# Patient Record
Sex: Male | Born: 1975 | Race: White | Hispanic: No | Marital: Married | Smoking: Current every day smoker
Health system: Southern US, Community
[De-identification: ages and names within clinical notes are randomized; demographics above are authoritative.]

## PROBLEM LIST (undated history)

## (undated) DIAGNOSIS — M1990 Unspecified osteoarthritis, unspecified site: Secondary | ICD-10-CM

## (undated) DIAGNOSIS — N200 Calculus of kidney: Secondary | ICD-10-CM

## (undated) DIAGNOSIS — I2510 Atherosclerotic heart disease of native coronary artery without angina pectoris: Secondary | ICD-10-CM

## (undated) HISTORY — PX: SHOULDER SURGERY: SHX246

## (undated) HISTORY — PX: URETERAL STENT PLACEMENT: SHX822

---

## 2006-09-04 ENCOUNTER — Emergency Department (HOSPITAL_COMMUNITY): Admission: EM | Admit: 2006-09-04 | Discharge: 2006-09-04 | Payer: Self-pay | Admitting: Emergency Medicine

## 2008-12-10 ENCOUNTER — Emergency Department (HOSPITAL_COMMUNITY): Admission: EM | Admit: 2008-12-10 | Discharge: 2008-12-10 | Payer: Self-pay | Admitting: Family Medicine

## 2009-07-10 ENCOUNTER — Emergency Department (HOSPITAL_COMMUNITY): Admission: EM | Admit: 2009-07-10 | Discharge: 2009-07-10 | Payer: Self-pay | Admitting: Emergency Medicine

## 2009-08-02 ENCOUNTER — Encounter: Admission: RE | Admit: 2009-08-02 | Discharge: 2009-08-30 | Payer: Self-pay | Admitting: Specialist

## 2010-08-01 ENCOUNTER — Emergency Department (HOSPITAL_COMMUNITY): Admission: EM | Admit: 2010-08-01 | Discharge: 2010-08-01 | Payer: Self-pay | Admitting: Emergency Medicine

## 2011-03-01 LAB — URINALYSIS, ROUTINE W REFLEX MICROSCOPIC
Glucose, UA: NEGATIVE mg/dL
Protein, ur: 100 mg/dL — AB
Specific Gravity, Urine: 1.025 (ref 1.005–1.030)
Urobilinogen, UA: 1 mg/dL (ref 0.0–1.0)

## 2011-03-01 LAB — URINE MICROSCOPIC-ADD ON

## 2011-05-03 NOTE — Discharge Summary (Signed)
NAMEWILDON, CUEVAS NO.:  [id number]   MEDICAL RECORD NO.:  [id number]          PATIENT TYPE:  EMS   LOCATION:  ED                            FACILITY:  [facility]   PHYSICIAN:  Ky Barban, M.D.DATE OF BIRTH:  Aug 03, 1976   DATE OF ADMISSION:  09/04/2006  DATE OF DISCHARGE:  LH                                 DISCHARGE SUMMARY   CHIEF COMPLAINT:  Left renal colic.   HISTORY:  A 35 year old gentleman.  He had lithotripsy done for left renal  calculus yesterday in [place].  Started to have severe pain this evening,  came to the emergency room.  CT was done, shows 1.3 mm stone in the left  ureteropelvic junction causing some hydronephrosis.  There are several other  small pieces in the left kidney.  The rest of the ureter looked fine.  Both  ureters do not show any stone.  He was in severe pain and IV pain medication  was given.  After a few  hours, the pain subsided.  When I came to see him,  he was completely asymptomatic, having no pain.  Initially, I had decided to  admit him but when I saw him in the ER, I decided that there was no need to  be admitted.  He can continue the same treatment at home.  He already has  pain medicine with Flomax and with antibiotics, and, today, he is agreeable  so he is going to go home and continue followup with his urologist.   PAST HISTORY:  History of kidney stones in the past.   FAMILY HISTORY:  Unremarkable.   ALLERGIES:  None.   MEDICATIONS:  He is taking  1. Flomax.  2. Percocet.  3. Antibiotic.   PHYSICAL EXAMINATION:  Well-nourished, well-developed male, not in any acute  distress, fully conscience, alert, oriented.  Blood pressure 120/64.  Pulse 97 per minute.  Temperature 98.1.  Abdomen soft, flat. Liver, spleen, kidneys not palpable.  No CVA tenderness.  External genitalia were negative.  RECTAL:  Not done.   CT scan, as I mentioned above.   IMPRESSION:  Left renal and left upper ureteral calculus,  small, and  __________ extracorporeal shockwave lithotripsy.   I recommend continuing to use his pain medicine, antibiotic, and Flomax.  Plenty of fluids.  If he has any more pain, he will have to come back.  As  long as there is no pain, he can follow with his urologist.      Ky Barban, M.D.  Electronically Signed     MIJ/MEDQ  D:  09/04/2006  T:  09/07/2006  Job:  [id number]

## 2011-06-18 ENCOUNTER — Emergency Department (HOSPITAL_COMMUNITY)
Admission: EM | Admit: 2011-06-18 | Discharge: 2011-06-18 | Disposition: A | Discharge status: Home / Self Care | Payer: 59 | Attending: Emergency Medicine | Admitting: Emergency Medicine

## 2011-06-18 ENCOUNTER — Emergency Department (HOSPITAL_COMMUNITY): Payer: 59

## 2011-06-18 DIAGNOSIS — F17200 Nicotine dependence, unspecified, uncomplicated: Secondary | ICD-10-CM | POA: Insufficient documentation

## 2011-06-18 DIAGNOSIS — N200 Calculus of kidney: Secondary | ICD-10-CM | POA: Insufficient documentation

## 2012-04-27 ENCOUNTER — Encounter (HOSPITAL_COMMUNITY): Payer: Self-pay | Admitting: *Deleted

## 2012-04-27 ENCOUNTER — Emergency Department (HOSPITAL_COMMUNITY)
Admission: EM | Admit: 2012-04-27 | Discharge: 2012-04-28 | Disposition: A | Discharge status: Home / Self Care | Payer: 59 | Attending: Emergency Medicine | Admitting: Emergency Medicine

## 2012-04-27 DIAGNOSIS — R1110 Vomiting, unspecified: Secondary | ICD-10-CM | POA: Insufficient documentation

## 2012-04-27 DIAGNOSIS — N200 Calculus of kidney: Secondary | ICD-10-CM | POA: Insufficient documentation

## 2012-04-27 DIAGNOSIS — R109 Unspecified abdominal pain: Secondary | ICD-10-CM | POA: Insufficient documentation

## 2012-04-27 HISTORY — DX: Calculus of kidney: N20.0

## 2012-04-27 NOTE — ED Provider Notes (Signed)
History     CSN: [id number]  Arrival date & time 04/27/12  2246   First MD Initiated Contact with Patient 04/27/12 2341      Chief Complaint  Patient presents with  . Flank Pain    (Consider location/radiation/quality/duration/timing/severity/associated sxs/prior treatment) HPI Comments: Also having associated vomiting.  Feels "dehydrated".  Last kidney stone 06-18-11.  CT then showed R UVJ stone 1-2 mm and "bilateral nephrolithiasis".  Sees dr. Hillis Range.  Patient is a 36 y.o. male presenting with flank pain. The history is provided by the patient. No language interpreter was used.  Flank Pain This is a new problem. The problem occurs constantly. The problem has been waxing and waning. Pertinent negatives include no chills or fever. The symptoms are aggravated by nothing. He has tried oral narcotics for the symptoms. The treatment provided mild relief.    Past Medical History  Diagnosis Date  . Kidney stones     Past Surgical History  Procedure Date  . Ureteral stent placement     History reviewed. No pertinent family history.  History  Substance Use Topics  . Smoking status: Current Everyday Smoker  . Smokeless tobacco: Not on file  . Alcohol Use: Yes      Review of Systems  Constitutional: Negative for fever and chills.  Genitourinary: Positive for flank pain. Negative for hematuria and penile pain.  All other systems reviewed and are negative.    Allergies  Review of patient's allergies indicates no known allergies.  Home Medications   Current Outpatient Rx  Name Route Sig Dispense Refill  . ONDANSETRON 4 MG PO TBDP Oral Take 1 tablet (4 mg total) by mouth every 8 (eight) hours as needed for nausea. 10 tablet 0  . OXYCODONE-ACETAMINOPHEN 7.5-325 MG PO TABS  One tab po q 4-6 hrs prn pain 20 tablet 0    BP 126/76  Pulse 60  Temp(Src) 97.7 F (36.5 C) (Oral)  Resp 14  Ht 6' (1.829 m)  Wt 185 lb (83.915 kg)  BMI 25.09 kg/m2  SpO2 100%  Physical  Exam  Nursing note and vitals reviewed. Constitutional: He is oriented to person, place, and time. He appears well-developed and well-nourished.  HENT:  Head: Normocephalic and atraumatic.  Eyes: EOM are normal.  Neck: Normal range of motion.  Cardiovascular: Normal rate, regular rhythm, normal heart sounds and intact distal pulses.   Pulmonary/Chest: Effort normal and breath sounds normal. No respiratory distress.  Abdominal: Soft. He exhibits no distension. There is no tenderness.  Musculoskeletal: Normal range of motion.       Localizes pain to L posterior flank.  No CVAT.  Neurological: He is alert and oriented to person, place, and time.  Skin: Skin is warm and dry.  Psychiatric: He has a normal mood and affect. Judgment normal.    ED Course  Procedures (including critical care time)  Labs Reviewed  CBC - Abnormal; Notable for the following:    WBC 11.2 (*)    All other components within normal limits  DIFFERENTIAL - Abnormal; Notable for the following:    Neutrophils Relative 81 (*)    Neutro Abs 9.1 (*)    Lymphocytes Relative 10 (*)    All other components within normal limits  BASIC METABOLIC PANEL - Abnormal; Notable for the following:    Sodium 131 (*)    Chloride 95 (*)    Glucose, Bld 115 (*)    All other components within normal limits  URINALYSIS, ROUTINE W REFLEX MICROSCOPIC -  Abnormal; Notable for the following:    Specific Gravity, Urine >1.030 (*)    Hgb urine dipstick LARGE (*)    All other components within normal limits  URINE MICROSCOPIC-ADD ON  LAB REPORT - SCANNED   No results found.   1. Kidney stone     0125- pt took a nap after pain medicine.  Up to urinate after 1000 NS bolus and having "no pain".  MDM  Pt agrees with not obtaining another CT since he has already had 2 this year.  It will be treated with narcotic and antiemetic.  He will drink plenty of fluids and f/u with dr. Retta Diones.        Worthy Rancher, PA 04/29/12  [phone]

## 2012-04-27 NOTE — ED Notes (Signed)
Lt flank pain, onset this am.  Nausea, vomiting.

## 2012-04-27 NOTE — ED Notes (Signed)
Received pt. From triage pt. Alert and oriented, gait steady, NAD noted

## 2012-04-27 NOTE — ED Notes (Signed)
EDP at the bedside.  ?

## 2012-04-28 LAB — URINALYSIS, ROUTINE W REFLEX MICROSCOPIC
Glucose, UA: NEGATIVE mg/dL
Leukocytes, UA: NEGATIVE
Nitrite: NEGATIVE
Specific Gravity, Urine: >1.03 — ABNORMAL HIGH (ref 1.005–1.030)

## 2012-04-28 LAB — URINE MICROSCOPIC-ADD ON

## 2012-04-28 LAB — DIFFERENTIAL
Basophils Relative: 0 % (ref 0–1)
Lymphs Abs: 1.1 10*3/uL (ref 0.7–4.0)
Monocytes Relative: 8 % (ref 3–12)

## 2012-04-28 LAB — BASIC METABOLIC PANEL
BUN: 11 mg/dL (ref 6–23)
GFR calc Af Amer: >90 mL/min (ref >90)

## 2012-04-28 LAB — CBC
MCV: 88 fL (ref 78.0–100.0)
Platelets: 185 10*3/uL (ref 150–400)

## 2012-04-28 MED ORDER — OXYCODONE-ACETAMINOPHEN 7.5-325 MG PO TABS: ORAL_TABLET | ORAL | Status: DC

## 2012-04-28 MED ORDER — ONDANSETRON 4 MG PO TBDP: 4.0000 mg | ORAL_TABLET | Freq: Three times a day (TID) | ORAL | Status: AC | PRN

## 2012-04-28 MED ORDER — ONDANSETRON HCL 4 MG/2ML IJ SOLN
4.0000 mg | Freq: Once | INTRAMUSCULAR | Status: AC
  Administered 2012-04-28: 4 mg via INTRAVENOUS
  Filled 2012-04-28: qty 2

## 2012-04-28 MED ORDER — HYDROMORPHONE HCL PF 2 MG/ML IJ SOLN
2.0000 mg | Freq: Once | INTRAMUSCULAR | Status: AC
  Administered 2012-04-28: 2 mg via INTRAVENOUS
  Filled 2012-04-28: qty 1

## 2012-04-28 MED ORDER — KETOROLAC TROMETHAMINE 30 MG/ML IJ SOLN
30.0000 mg | Freq: Once | INTRAMUSCULAR | Status: AC
  Administered 2012-04-28: 30 mg via INTRAVENOUS
  Filled 2012-04-28: qty 1

## 2012-04-28 NOTE — ED Notes (Signed)
Pt. Discharged to home with wife, via w/c, NAD noted, respirations even and regular

## 2012-04-28 NOTE — Discharge Instructions (Signed)
Kidney Stones Kidney stones (ureteral lithiasis) are solid masses that form inside your kidneys. The intense pain is caused by the stone moving through the kidney, ureter, bladder, and urethra (urinary tract). When the stone moves, the ureter starts to spasm around the stone. The stone is usually passed in the urine.  HOME CARE  Drink enough fluids to keep your pee (urine) clear or pale yellow. This helps to get the stone out.   Strain all pee through the provided strainer. Do not pee without peeing through the strainer, not even once. If you pee the stone out, catch it. The stone may be as small as a grain of salt. Take this to your doctor.   Only take medicine as told by your doctor.   Follow up with your doctor as told.   Get follow-up X-rays as told by your doctor.  GET HELP RIGHT AWAY IF:   Your pain does not get better with medicine.   You have a fever.   Your pain increases and gets worse over 18 hours.   You have new belly (abdominal) pain.   You feel faint or pass out.  MAKE SURE YOU:   Understand these instructions.   Will watch your condition.   Will get help right away if you are not doing well or get worse.  Document Released: 05/20/2008 Document Revised: 11/21/2011 Document Reviewed: 09/29/2009 [facility] Patient Information 2012 [place], [place].   Take the meds as directed.  Drink plenty of fluids.  Call your urologist and make an appt for further evaluation.  Return to the ED if your symptoms worsen or change in the meantime.

## 2012-04-30 NOTE — ED Provider Notes (Signed)
Medical screening examination/treatment/procedure(s) were performed by non-physician practitioner and as supervising physician I was immediately available for consultation/collaboration.  Nimisha Rathel S. Deaire Mcwhirter, MD 04/30/12 0625 

## 2014-07-09 ENCOUNTER — Encounter (HOSPITAL_COMMUNITY): Payer: Self-pay | Admitting: Emergency Medicine

## 2014-07-09 ENCOUNTER — Emergency Department (HOSPITAL_COMMUNITY)
Admission: EM | Admit: 2014-07-09 | Discharge: 2014-07-10 | Disposition: A | Discharge status: Home / Self Care | Payer: 59 | Attending: Emergency Medicine | Admitting: Emergency Medicine

## 2014-07-09 DIAGNOSIS — Z79899 Other long term (current) drug therapy: Secondary | ICD-10-CM | POA: Insufficient documentation

## 2014-07-09 DIAGNOSIS — Z23 Encounter for immunization: Secondary | ICD-10-CM | POA: Insufficient documentation

## 2014-07-09 DIAGNOSIS — S81012A Laceration without foreign body, left knee, initial encounter: Secondary | ICD-10-CM

## 2014-07-09 DIAGNOSIS — Y92410 Unspecified street and highway as the place of occurrence of the external cause: Secondary | ICD-10-CM | POA: Insufficient documentation

## 2014-07-09 DIAGNOSIS — S81809A Unspecified open wound, unspecified lower leg, initial encounter: Principal | ICD-10-CM

## 2014-07-09 DIAGNOSIS — S81009A Unspecified open wound, unspecified knee, initial encounter: Secondary | ICD-10-CM | POA: Insufficient documentation

## 2014-07-09 DIAGNOSIS — F17200 Nicotine dependence, unspecified, uncomplicated: Secondary | ICD-10-CM | POA: Insufficient documentation

## 2014-07-09 DIAGNOSIS — S91009A Unspecified open wound, unspecified ankle, initial encounter: Principal | ICD-10-CM

## 2014-07-09 DIAGNOSIS — Z87442 Personal history of urinary calculi: Secondary | ICD-10-CM | POA: Insufficient documentation

## 2014-07-09 DIAGNOSIS — Y9389 Activity, other specified: Secondary | ICD-10-CM | POA: Insufficient documentation

## 2014-07-09 MED ORDER — LIDOCAINE HCL (PF) 1 % IJ SOLN
5.0000 mL | Freq: Once | INTRAMUSCULAR | Status: DC
  Filled 2014-07-09: qty 5

## 2014-07-09 MED ORDER — TETANUS-DIPHTH-ACELL PERTUSSIS 5-2.5-18.5 LF-MCG/0.5 IM SUSP
0.5000 mL | Freq: Once | INTRAMUSCULAR | Status: AC
  Administered 2014-07-10: 0.5 mL via INTRAMUSCULAR
  Filled 2014-07-09: qty 0.5
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
## 2014-07-10 ENCOUNTER — Emergency Department (HOSPITAL_COMMUNITY): Payer: 59

## 2014-07-10 MED ORDER — CEPHALEXIN 500 MG PO CAPS
500.0000 mg | ORAL_CAPSULE | Freq: Once | ORAL | Status: AC
  Administered 2014-07-10: 500 mg via ORAL
  Filled 2014-07-10: qty 1

## 2014-07-10 MED ORDER — CEPHALEXIN 500 MG PO CAPS: 500.0000 mg | ORAL_CAPSULE | Freq: Three times a day (TID) | ORAL | Status: DC

## 2014-07-10 NOTE — ED Provider Notes (Signed)
Medical screening examination/treatment/procedure(s) were conducted as a shared visit with non-physician practitioner(s) and myself.  I personally evaluated the patient during the encounter.   EKG Interpretation None      Patient with laceration to the left knee approximately 8 hours prior to arrival.  Tetanus is not up-to-date. Patient reports increasing pain. Approximately 2 cm linear laceration over the left knee. No exposed muscle or tendon. Range of motion intact. Tetanus will be updated. Laceration was loosely approximated. No signs of infection.  After history, exam, and medical workup I feel the patient has been appropriately medically screened and is safe for discharge home. Pertinent diagnoses were discussed with the patient. Patient was given return precautions.   Shon Batonourtney F Linzey Ramser, MD 07/10/14 [phone]

## 2014-11-12 ENCOUNTER — Emergency Department (HOSPITAL_COMMUNITY)
Admission: EM | Admit: 2014-11-12 | Discharge: 2014-11-12 | Disposition: A | Discharge status: Home / Self Care | Payer: 59 | Source: Home / Self Care | Attending: Family Medicine | Admitting: Family Medicine

## 2014-11-12 ENCOUNTER — Encounter (HOSPITAL_COMMUNITY): Payer: Self-pay | Admitting: *Deleted

## 2014-11-12 DIAGNOSIS — N410 Acute prostatitis: Secondary | ICD-10-CM

## 2014-11-12 LAB — POCT URINALYSIS DIP (DEVICE)
Bilirubin Urine: NEGATIVE
Glucose, UA: NEGATIVE mg/dL
Hgb urine dipstick: NEGATIVE
Ketones, ur: NEGATIVE mg/dL
LEUKOCYTES UA: NEGATIVE
NITRITE: NEGATIVE
Protein, ur: NEGATIVE mg/dL
SPECIFIC GRAVITY, URINE: 1.02 (ref 1.005–1.030)
UROBILINOGEN UA: 0.2 mg/dL (ref 0.0–1.0)
pH: 5.5 (ref 5.0–8.0)

## 2014-11-12 MED ORDER — DOXYCYCLINE HYCLATE 50 MG PO CAPS: 50.0000 mg | ORAL_CAPSULE | Freq: Two times a day (BID) | ORAL | Status: DC

## 2014-11-12 NOTE — Discharge Instructions (Signed)
Take all of medicine as directed, drink lots of fluids, reduce your caffeine, see your doctor if further problems.

## 2014-11-12 NOTE — ED Provider Notes (Signed)
CSN: [id number]     Arrival date & time 11/12/14  1049 History   First MD Initiated Contact with Patient 11/12/14 1057     Chief Complaint  Patient presents with  . Urinary Frequency   (Consider location/radiation/quality/duration/timing/severity/associated sxs/prior Treatment) Patient is a 38 y.o. male presenting with frequency. The history is provided by the patient.  Urinary Frequency This is a new problem. The current episode started 2 days ago. The problem has been gradually worsening. Pertinent negatives include no chest pain and no abdominal pain.    Past Medical History  Diagnosis Date  . Kidney stones    Past Surgical History  Procedure Laterality Date  . Ureteral stent placement     History reviewed. No pertinent family history. History  Substance Use Topics  . Smoking status: Current Every Day Smoker  . Smokeless tobacco: Not on file  . Alcohol Use: Yes    Review of Systems  Constitutional: Negative.   Cardiovascular: Negative for chest pain.  Gastrointestinal: Negative.  Negative for abdominal pain.  Genitourinary: Positive for dysuria and frequency. Negative for urgency, hematuria, discharge, penile swelling, penile pain and testicular pain.    Allergies  Review of patient's allergies indicates no known allergies.  Home Medications   Prior to Admission medications   Medication Sig Start Date End Date Taking? Authorizing Provider  cephALEXin (KEFLEX) 500 MG capsule Take 1 capsule (500 mg total) by mouth 3 (three) times daily. 07/10/14   Hope Orlene OchM Neese, NP  oxyCODONE-acetaminophen (PERCOCET) 7.5-325 MG per tablet One tab po q 4-6 hrs prn pain 04/28/12   Richard Hyacinth MeekerMiller, PA-C   BP 123/80 mmHg  Pulse 85  Temp(Src) 97.4 F (36.3 C) (Oral)  Resp 16  SpO2 99% Physical Exam  Constitutional: He appears well-developed and well-nourished.  Abdominal: Soft. Bowel sounds are normal. Hernia confirmed negative in the right inguinal area and confirmed negative in the  left inguinal area.  Genitourinary: Rectum normal, testes normal and penis normal. Prostate is tender. Prostate is not enlarged. Right testis shows no tenderness. Left testis shows no tenderness. Circumcised. No penile tenderness. No discharge found.  Musculoskeletal: Normal range of motion.  Lymphadenopathy:       Right: No inguinal adenopathy present.       Left: No inguinal adenopathy present.  Nursing note and vitals reviewed.   ED Course  Procedures (including critical care time) Labs Review Labs Reviewed  POCT URINALYSIS DIP (DEVICE)    Imaging Review No results found.   MDM   1. Prostatitis, acute        Linna HoffJames D Kindl, MD 11/12/14 1134

## 2014-11-12 NOTE — ED Notes (Signed)
Pt  Reports  Frequency  And  Burning  In  Urination   With  Symptoms  For  sev  Days    denys any  Discharge     Or    Sores   - he        Is  Sitting  Upright  On  Exam           Table           Speaking  In  Complete   sentances

## 2016-07-14 IMAGING — CR DG KNEE COMPLETE 4+V*L*
4 series · 4 of 4 positions shown · non-contrast
Comparison: None.

CLINICAL DATA: Extremity laceration to anterior knee.

EXAM:
LEFT KNEE - COMPLETE 4+ VIEW

[view not recorded (1 of 4)]
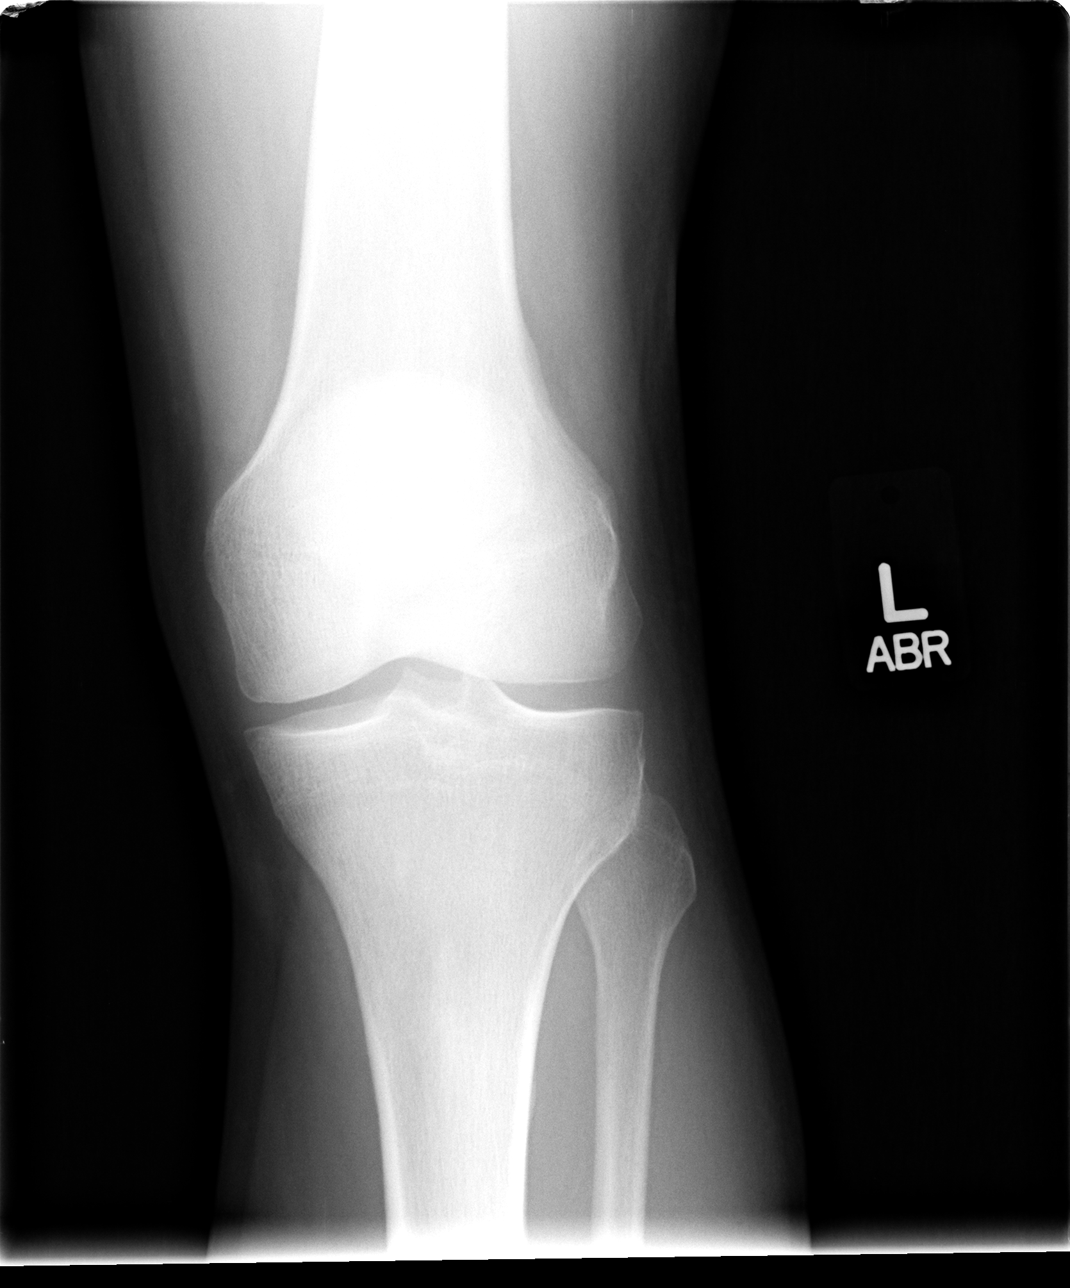

[view not recorded (2 of 4)]
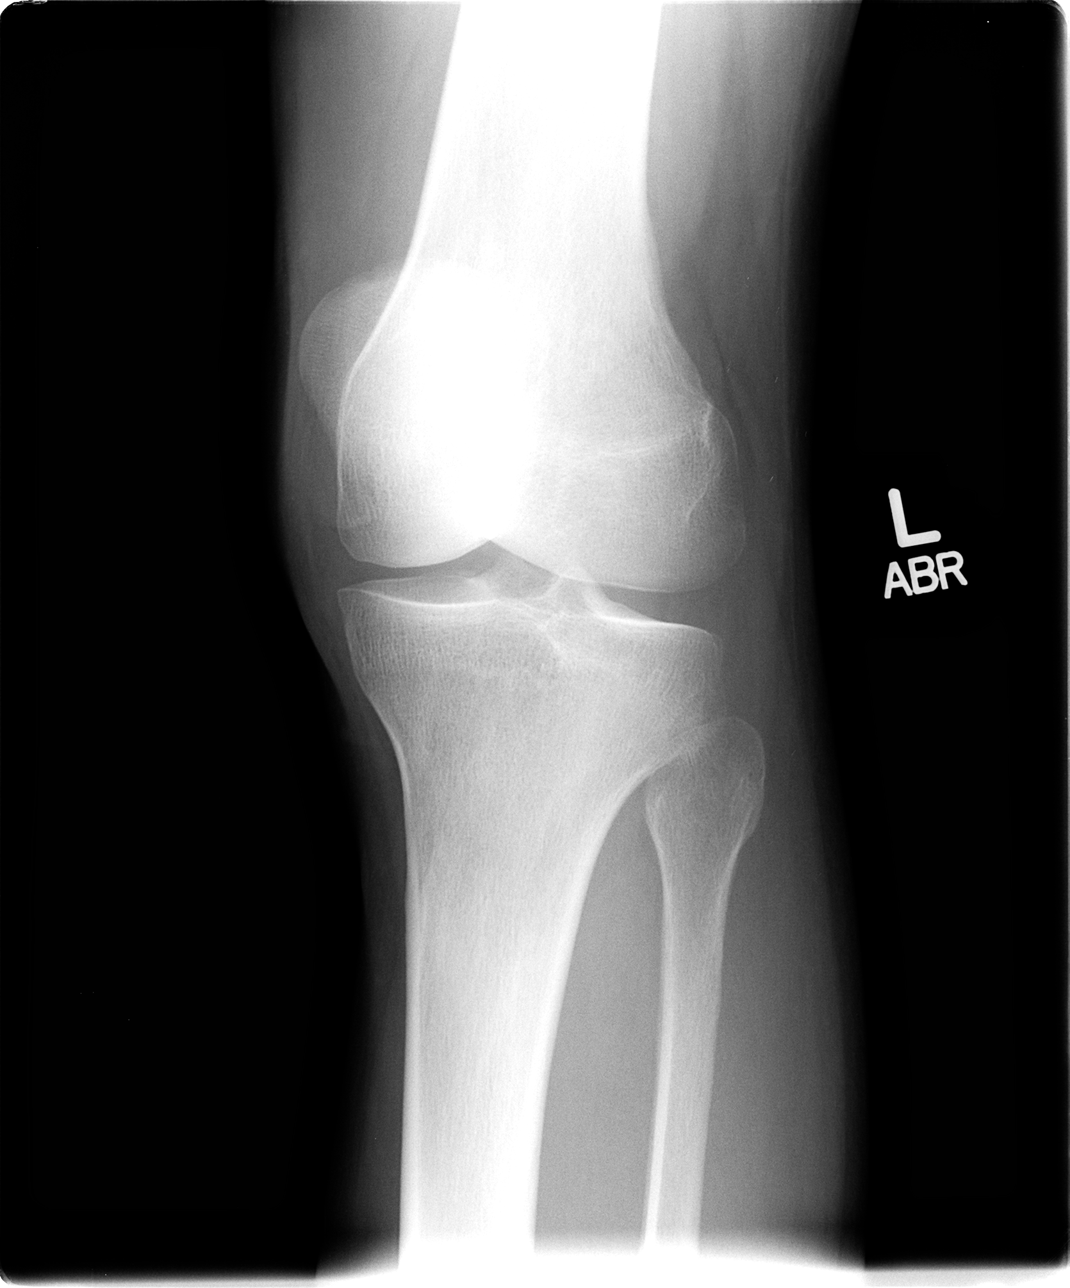

[view not recorded (3 of 4)]
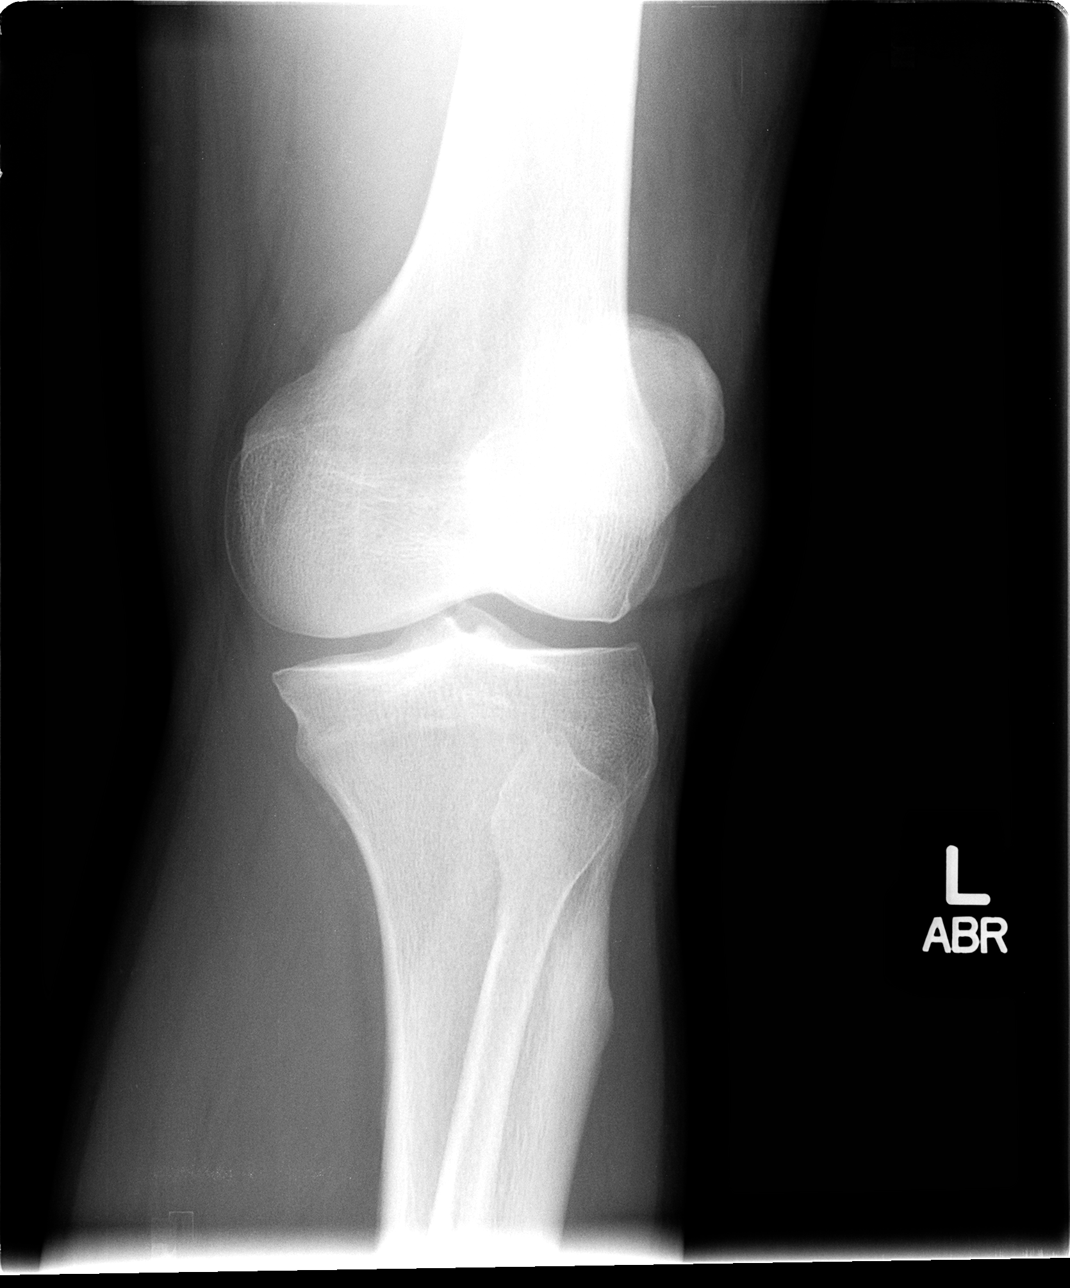

[view not recorded (4 of 4)]
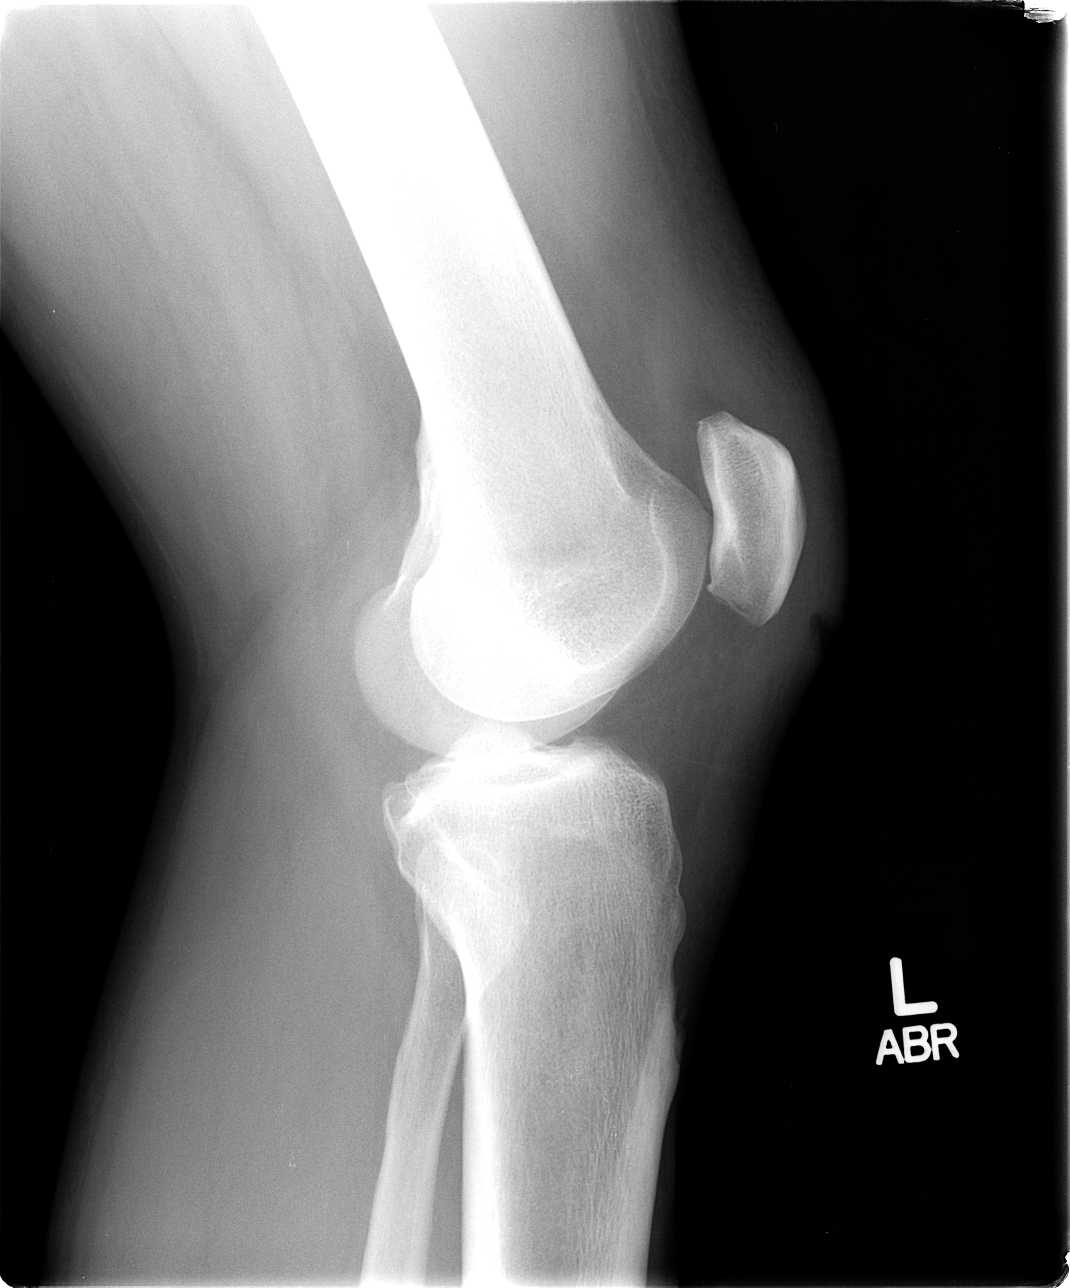

[4 of 4 positions shown; findings below may reference images not displayed]

FINDINGS: There is no evidence of fracture, dislocation, or joint effusion.
There is no evidence of arthropathy or other focal bone abnormality.

Soft tissue laceration present within the prepatellar soft tissues.
No retained foreign body.
IMPRESSION: 1. Soft tissue laceration within the prepatellar soft tissues. No
retained foreign body.
2. No acute fracture or dislocation.

## 2021-05-29 ENCOUNTER — Other Ambulatory Visit (HOSPITAL_COMMUNITY): Payer: Self-pay

## 2021-05-29 MED ORDER — MELOXICAM 15 MG PO TABS
ORAL_TABLET | ORAL | 2 refills | Status: DC
  Filled 2021-05-29: qty 30, 30d supply, fill #0

## 2022-05-08 ENCOUNTER — Emergency Department (HOSPITAL_COMMUNITY): Payer: No Typology Code available for payment source

## 2022-05-08 ENCOUNTER — Encounter (HOSPITAL_COMMUNITY): Payer: Self-pay | Admitting: Emergency Medicine

## 2022-05-08 ENCOUNTER — Other Ambulatory Visit: Payer: Self-pay

## 2022-05-08 ENCOUNTER — Inpatient Hospital Stay (HOSPITAL_COMMUNITY)
Admission: EM | Admit: 2022-05-08 | Discharge: 2022-05-13 | DRG: 854 | Disposition: A | Discharge status: Home / Self Care | Payer: No Typology Code available for payment source | Attending: General Surgery | Admitting: General Surgery

## 2022-05-08 DIAGNOSIS — Q430 Meckel's diverticulum (displaced) (hypertrophic): Secondary | ICD-10-CM

## 2022-05-08 DIAGNOSIS — F17200 Nicotine dependence, unspecified, uncomplicated: Secondary | ICD-10-CM | POA: Diagnosis present

## 2022-05-08 DIAGNOSIS — Z8249 Family history of ischemic heart disease and other diseases of the circulatory system: Secondary | ICD-10-CM | POA: Diagnosis not present

## 2022-05-08 DIAGNOSIS — R350 Frequency of micturition: Secondary | ICD-10-CM | POA: Diagnosis present

## 2022-05-08 DIAGNOSIS — L409 Psoriasis, unspecified: Secondary | ICD-10-CM | POA: Diagnosis not present

## 2022-05-08 DIAGNOSIS — E871 Hypo-osmolality and hyponatremia: Secondary | ICD-10-CM | POA: Diagnosis present

## 2022-05-08 DIAGNOSIS — K567 Ileus, unspecified: Secondary | ICD-10-CM | POA: Diagnosis not present

## 2022-05-08 DIAGNOSIS — A419 Sepsis, unspecified organism: Secondary | ICD-10-CM | POA: Diagnosis present

## 2022-05-08 LAB — CBC WITH DIFFERENTIAL/PLATELET
Abs Immature Granulocytes: 0.03 10*3/uL (ref 0.00–0.07)
Basophils Absolute: 0.1 10*3/uL (ref 0.0–0.1)
Basophils Relative: 1 %
Eosinophils Absolute: 0.4 10*3/uL (ref 0.0–0.5)
Eosinophils Relative: 3 %
HCT: 49.6 % (ref 39.0–52.0)
Hemoglobin: 16.9 g/dL (ref 13.0–17.0)
Immature Granulocytes: 0 %
Lymphocytes Relative: 12 %
Lymphs Abs: 1.7 10*3/uL (ref 0.7–4.0)
MCH: 30.7 pg (ref 26.0–34.0)
MCHC: 34.1 g/dL (ref 30.0–36.0)
MCV: 90.2 fL (ref 80.0–100.0)
Monocytes Absolute: 1.4 10*3/uL — ABNORMAL HIGH (ref 0.1–1.0)
Monocytes Relative: 10 %
Neutro Abs: 11 10*3/uL — ABNORMAL HIGH (ref 1.7–7.7)
Neutrophils Relative %: 74 %
Platelets: 216 10*3/uL (ref 150–400)
RBC: 5.5 MIL/uL (ref 4.22–5.81)
RDW: 12.5 % (ref 11.5–15.5)
WBC: 14.6 10*3/uL — ABNORMAL HIGH (ref 4.0–10.5)
nRBC: 0 % (ref 0.0–0.2)

## 2022-05-08 LAB — COMPREHENSIVE METABOLIC PANEL
ALT: 15 U/L (ref 0–44)
AST: 15 U/L (ref 15–41)
Albumin: 3.8 g/dL (ref 3.5–5.0)
Alkaline Phosphatase: 95 U/L (ref 38–126)
Anion gap: 5 (ref 5–15)
BUN: 12 mg/dL (ref 6–20)
CO2: 26 mmol/L (ref 22–32)
Calcium: 8.3 mg/dL — ABNORMAL LOW (ref 8.9–10.3)
Chloride: 103 mmol/L (ref 98–111)
Creatinine, Ser: 0.98 mg/dL (ref 0.61–1.24)
GFR, Estimated: >60 mL/min (ref >60)
Glucose, Bld: 85 mg/dL (ref 70–99)
Potassium: 3.8 mmol/L (ref 3.5–5.1)
Sodium: 134 mmol/L — ABNORMAL LOW (ref 135–145)
Total Bilirubin: 1.2 mg/dL (ref 0.3–1.2)
Total Protein: 6.9 g/dL (ref 6.5–8.1)

## 2022-05-08 LAB — URINALYSIS, ROUTINE W REFLEX MICROSCOPIC
Bacteria, UA: NONE SEEN
Bilirubin Urine: NEGATIVE
Glucose, UA: NEGATIVE mg/dL
Ketones, ur: NEGATIVE mg/dL
Leukocytes,Ua: NEGATIVE
Nitrite: NEGATIVE
Protein, ur: NEGATIVE mg/dL
Specific Gravity, Urine: 1.018 (ref 1.005–1.030)
pH: 6 (ref 5.0–8.0)

## 2022-05-08 MED ORDER — TRAZODONE HCL 50 MG PO TABS: 25.0000 mg | ORAL_TABLET | Freq: Every evening | ORAL | Status: DC | PRN

## 2022-05-08 MED ORDER — ONDANSETRON HCL 4 MG PO TABS: 4.0000 mg | ORAL_TABLET | Freq: Four times a day (QID) | ORAL | Status: DC | PRN

## 2022-05-08 MED ORDER — TRIAMCINOLONE ACETONIDE 0.1 % EX CREA
1.0000 "application " | TOPICAL_CREAM | Freq: Three times a day (TID) | CUTANEOUS | Status: DC
  Administered 2022-05-09: 1 via TOPICAL
  Filled 2022-05-08: qty 15

## 2022-05-08 MED ORDER — IOHEXOL 300 MG/ML  SOLN
100.0000 mL | Freq: Once | INTRAMUSCULAR | Status: AC | PRN
  Administered 2022-05-08: 100 mL via INTRAVENOUS

## 2022-05-08 MED ORDER — ACETAMINOPHEN 650 MG RE SUPP: 650.0000 mg | Freq: Four times a day (QID) | RECTAL | Status: DC | PRN

## 2022-05-08 MED ORDER — SODIUM CHLORIDE 0.9 % IV BOLUS (SEPSIS)
500.0000 mL | Freq: Once | INTRAVENOUS | Status: AC
  Administered 2022-05-08: 500 mL via INTRAVENOUS

## 2022-05-08 MED ORDER — PIPERACILLIN-TAZOBACTAM 3.375 G IVPB
3.3750 g | Freq: Three times a day (TID) | INTRAVENOUS | Status: DC
  Administered 2022-05-09 (×2): 3.375 g via INTRAVENOUS
  Filled 2022-05-08 (×2): qty 50

## 2022-05-08 MED ORDER — SODIUM CHLORIDE 0.9 % IV SOLN: 2.0000 g | INTRAVENOUS | Status: DC

## 2022-05-08 MED ORDER — ONDANSETRON HCL 4 MG/2ML IJ SOLN: 4.0000 mg | Freq: Four times a day (QID) | INTRAMUSCULAR | Status: DC | PRN

## 2022-05-08 MED ORDER — SODIUM CHLORIDE 0.9 % IV BOLUS (SEPSIS)
1000.0000 mL | Freq: Once | INTRAVENOUS | Status: AC
  Administered 2022-05-08: 1000 mL via INTRAVENOUS

## 2022-05-08 MED ORDER — PIPERACILLIN-TAZOBACTAM 3.375 G IVPB 30 MIN
3.3750 g | Freq: Four times a day (QID) | INTRAVENOUS | Status: DC
Start: 2022-05-09 — End: 2022-05-08

## 2022-05-08 MED ORDER — MORPHINE SULFATE (PF) 2 MG/ML IV SOLN: 2.0000 mg | INTRAVENOUS | Status: DC | PRN

## 2022-05-08 MED ORDER — ACETAMINOPHEN 325 MG PO TABS
650.0000 mg | ORAL_TABLET | Freq: Four times a day (QID) | ORAL | Status: DC | PRN
  Administered 2022-05-09: 650 mg via ORAL
  Filled 2022-05-08: qty 2

## 2022-05-08 MED ORDER — POTASSIUM CHLORIDE IN NACL 20-0.9 MEQ/L-% IV SOLN: INTRAVENOUS | Status: DC

## 2022-05-08 MED ORDER — METRONIDAZOLE 500 MG/100ML IV SOLN: 500.0000 mg | Freq: Two times a day (BID) | INTRAVENOUS | Status: DC

## 2022-05-08 MED ORDER — MAGNESIUM HYDROXIDE 400 MG/5ML PO SUSP: 30.0000 mL | Freq: Every day | ORAL | Status: DC | PRN

## 2022-05-08 MED ORDER — PIPERACILLIN-TAZOBACTAM 3.375 G IVPB 30 MIN
3.3750 g | Freq: Once | INTRAVENOUS | Status: AC
  Administered 2022-05-08: 3.375 g via INTRAVENOUS
  Filled 2022-05-08: qty 50

## 2022-05-08 NOTE — Assessment & Plan Note (Signed)
-   We will resume his Kenalog.

## 2022-05-08 NOTE — Assessment & Plan Note (Deleted)
-   This is manifested by leukocytosis and tachycardia. 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 - The patient will be hydrated with IV normal saline with added potassium chloride. - We will follow blood cultures. - The patient will be continued on IV Zosyn. - We will follow lactic acid level.

## 2022-05-08 NOTE — ED Notes (Signed)
Patient transported to CT 

## 2022-05-08 NOTE — ED Triage Notes (Signed)
Pt having abdominal pain x 2 days with frequent urination.  History of kidney stones.

## 2022-05-08 NOTE — ED Notes (Signed)
ED Provider at bedside. 

## 2022-05-08 NOTE — ED Notes (Signed)
Pt ambulated to the bathroom unassisted.  

## 2022-05-08 NOTE — Assessment & Plan Note (Signed)
-   This is manifested by leukocytosis and tachycardia. 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 - The patient will be hydrated with IV normal saline with added potassium chloride. - We will follow blood cultures. - The patient will be continued on IV Zosyn. - We will follow lactic acid level. 

## 2022-05-08 NOTE — ED Notes (Signed)
Provider at bedside

## 2022-05-08 NOTE — ED Provider Notes (Signed)
[facility] EMERGENCY DEPARTMENT Provider Note   CSN: [id number] Arrival date & time: 05/08/22  1557     History  Chief Complaint  Patient presents with   Abdominal Pain    Kenneth Warren is a 46 y.o. male.   Abdominal Pain Associated symptoms: no chest pain, no chills, no diarrhea, no dysuria, no fever, no nausea, no shortness of breath and no vomiting       Kenneth Warren is a 46 y.o. male past medical history of kidney stones who presents to the Emergency Department complaining of lower abdominal pain x2 days.  Pain is been associated with frequent urination and feeling as though he is unable to completely empty his bladder.  He was concerned that he may have a recurrent kidney stone.  Pain has been persistent since onset.  He does note having some pain of his left flank earlier today while sitting but otherwise does not have flank pain.  Denies any fever, chills, nausea or vomiting.  No diarrhea, black or bloody stools.  Pain of his lower abdomen is worse with movement.  Home Medications Prior to Admission medications   Medication Sig Start Date End Date Taking? Authorizing Provider  meloxicam (MOBIC) 15 MG tablet Take 1 tablet by mouth once daily 05/29/21  Yes   triamcinolone cream (KENALOG) 0.1 % Apply 1 application. topically 3 (three) times daily. 07/31/21  Yes [provider]  Calcipotriene-Betameth Diprop (ENSTILAR) 0.005-0.064 % FOAM Enstilar 0.005 %-0.064 % topical foam  APPLY TO AFFECTED AREA EXTERNALLY ONCE DAILY FOR 14 DAYS WHEN FLARED. Patient not taking: Reported on 05/08/2022    [provider]      Allergies    Patient has no known allergies.    Review of Systems   Review of Systems  Constitutional:  Negative for appetite change, chills and fever.  Respiratory:  Negative for shortness of breath.   Cardiovascular:  Negative for chest pain.  Gastrointestinal:  Positive for abdominal pain. Negative for diarrhea, nausea and  vomiting.  Genitourinary:  Positive for decreased urine volume, difficulty urinating, flank pain and frequency. Negative for dysuria.  Musculoskeletal:  Negative for arthralgias.   Physical Exam Updated Vital Signs BP 133/86 (BP Location: Left Arm)   Pulse 89   Temp 98.2 F (36.8 C) (Oral)   Resp 16   Ht 6' (1.829 m)   Wt 81.6 kg   SpO2 100%   BMI 24.41 kg/m  Physical Exam Vitals and nursing note reviewed.  Constitutional:      General: He is not in acute distress.    Appearance: Normal appearance. He is well-developed. He is not ill-appearing or toxic-appearing.  Cardiovascular:     Rate and Rhythm: Normal rate and regular rhythm.     Pulses: Normal pulses.  Pulmonary:     Effort: Pulmonary effort is normal.  Abdominal:     Palpations: Abdomen is soft.     Tenderness: There is abdominal tenderness. There is no right CVA tenderness or left CVA tenderness.     Comments: Diffuse tenderness to palpation of the lower abdomen primarily suprapubic.  No guarding or rebound, no CVA tenderness.  Musculoskeletal:        General: Normal range of motion.     Cervical back: Normal range of motion.  Neurological:     General: No focal deficit present.     Mental Status: He is alert.     Motor: No weakness.    ED Results / Procedures / Treatments  Labs (all labs ordered are listed, but only abnormal results are displayed) Labs Reviewed  URINALYSIS, ROUTINE W REFLEX MICROSCOPIC - Abnormal; Notable for the following components:      Result Value   Hgb urine dipstick SMALL (*)    All other components within normal limits  COMPREHENSIVE METABOLIC PANEL - Abnormal; Notable for the following components:   Sodium 134 (*)    Calcium 8.3 (*)    All other components within normal limits  CBC WITH DIFFERENTIAL/PLATELET - Abnormal; Notable for the following components:   WBC 14.6 (*)    Neutro Abs 11.0 (*)    Monocytes Absolute 1.4 (*)    All other components within normal limits     EKG None  Radiology CT ABDOMEN PELVIS W CONTRAST  Result Date: 05/08/2022 CLINICAL DATA:  Lower abdominal pain EXAM: CT ABDOMEN AND PELVIS WITH CONTRAST TECHNIQUE: Multidetector CT imaging of the abdomen and pelvis was performed using the standard protocol following bolus administration of intravenous contrast. RADIATION DOSE REDUCTION: This exam was performed according to the departmental dose-optimization program which includes automated exposure control, adjustment of the mA and/or kV according to patient size and/or use of iterative reconstruction technique. CONTRAST:  OMNIPAQUE IOHEXOL 300 MG/ML  SOLN COMPARISON:  06/18/2011 FINDINGS: Lower chest: Lung bases are clear. Hepatobiliary: Liver is within normal limits. Gallbladder is unremarkable. No intrahepatic or extrahepatic ductal dilatation. Pancreas: Within normal limits. Spleen: Within normal limits. Adrenals/Urinary Tract: Adrenal glands are within normal limits. Bilateral renal cysts, including a 1.7 cm right upper pole renal sinus cyst (series 7/image 21). Multiple nonobstructing bilateral renal calculi, measuring up to 5 mm in the left lower pole (series 2/image 30) and 4 mm in the right lower pole (series 2/image 34). No ureteral or bladder calculi. No hydronephrosis. Bladder is within normal limits. Stomach/Bowel: Stomach is notable for a tiny hiatal hernia. No evidence of bowel obstruction. Suspected appendix is within normal limits (series 2/image 50). Thickened loop of mid ileum in the right lower abdomen (series 2/image 69) with associated blind-ending tubular pouch and surrounding inflammatory change (series 2/image 68), compatible with Meckel's diverticulitis. No drainable fluid collection/abscess. No free air to suggest macroscopic perforation. No colonic wall thickening or inflammatory changes. Vascular/Lymphatic: No evidence of abdominal aortic aneurysm. Atherosclerotic calcifications of the abdominal aorta and branch  vessels. No suspicious abdominopelvic lymphadenopathy. Reproductive: Prostate is unremarkable. Other: Trace pelvic fluid. Musculoskeletal: Very mild degenerative changes at L3-4. IMPRESSION: Acute Meckel's diverticulitis. No drainable fluid collection/abscess. No free air to suggest macroscopic perforation. Surgical consultation is suggested. Additional ancillary findings as above. Electronically Signed   By: Charline Bills M.D.   On: 05/08/2022 20:15    Procedures Procedures    Medications Ordered in ED Medications  iohexol (OMNIPAQUE) 300 MG/ML solution 100 mL (100 mLs Intravenous Contrast Given 05/08/22 1958)    ED Course/ Medical Decision Making/ A&P                           Medical Decision Making Patient here with lower abdominal pain x2 days.  Pain associated with urinary pressure and increased frequency.  Feels like he is unable to empty his bladder when voiding.  History of kidney stones concerned he may have recurrent stone.  Pain began as lower abdominal pain.  Pain nonradiating.  On exam, patient has diffuse lower abdominal pain without guarding or rebound.  No CVA tenderness.  Otherwise well-appearing.  Vital signs are reassuring.  History concerning  but does not sound like typical gallbladder.  Will check bladder scan, labs, and CT abdomen and pelvis  Amount and/or Complexity of Data Reviewed Labs: ordered.    Details: Labs show leukocytosis with white count of 14,600.  Chemistries unremarkable, urinalysis shows small hemoglobin but no evidence of infection Radiology: ordered.    Details: CT abdomen and pelvis shows acute Meckel's diverticulitis without evidence of drainable fluid collection or abscess there is no free air to suggest perforation. Discussion of management or test interpretation with external provider(s): Discussed findings with the patient.  I have consulted general surgery, Dr. Lovell Sheehan who recommends to have patient admitted to hospital, n.p.o. tonight, IV  Zosyn and he will consult tomorrow   Discussed with [facility] hospitalist, Dr. Arville Care who agrees to admit  Risk Prescription drug management.           Final Clinical Impression(s) / ED Diagnoses Final diagnoses:  Meckel's diverticulitis    Rx / DC Orders ED Discharge Orders     None         Pauline Aus, PA-C 05/08/22 2156    Vanetta Mulders, MD 05/24/22 [phone]

## 2022-05-08 NOTE — Assessment & Plan Note (Signed)
-   The patient will be admitted to a medical telemetry bed. - We will continue antibiotic therapy with IV Zosyn. - Pain management will be provided. - The patient will be kept n.p.o. after midnight. - General surgery consult will be obtained. - Dr. Lovell Sheehan was notified and is aware about the patient.

## 2022-05-08 NOTE — H&P (Addendum)
[place]   PATIENT NAME: Kenneth Warren    MR#:  [id number]  DATE OF BIRTH:  1976/03/25  DATE OF ADMISSION:  05/08/2022  PRIMARY CARE PHYSICIAN: Benita Stabile, MD   Patient is coming from: Home  REQUESTING/REFERRING PHYSICIAN: Pauline Aus, PA-C  CHIEF COMPLAINT:   Chief Complaint  Patient presents with   Abdominal Pain    HISTORY OF PRESENT ILLNESS:  Kenneth Warren is a 46 y.o. male with medical history significant for nephrolithiasis, who presented to the emergency room with acute onset of lower abdominal and suprapubic pain over the last couple of days.  He has been having urinary frequency and mild hesitancy with a feeling sometimes that he is not able to empty his bladder.  No dysuria, oliguria or hematuria or flank pain.  He denied any any nausea or vomiting or diarrhea.  He felt he was constipated and took  a laxative.  He has been passing flatus.  No change in his appetite.  His last meal was at 1 PM.  No fever or chills.  No melena or bright red blood per rectum.  No chest pain or palpitations.  No cough or wheezing or dyspnea.  ED Course: Upon presentation to the emergency room, heart rate was 104 with otherwise normal vital signs.  Labs revealed mild hyponatremia 134 and leukocytosis of 14.6 with neutrophilia.  UA came back negative. EKG as reviewed by me : Pending. Imaging: Abdominal pelvic CT scan revealed acute Meckel's diverticulitis with no drainable fluid collection/abscess and no free air to suggest macroscopic perforation.  The case was discussed with Dr. Lovell Sheehan who recommended admission here and n.p.o. after midnight.  The patient will be started on IV Zosyn.  He will be admitted to a medical telemetry bed for further evaluation and management. PAST MEDICAL HISTORY:   Past Medical History:  Diagnosis Date   Kidney stones     PAST SURGICAL HISTORY:   Past Surgical History:  Procedure Laterality Date   SHOULDER SURGERY Right     URETERAL STENT PLACEMENT      SOCIAL HISTORY:   Social History   Tobacco Use   Smoking status: Every Day   Smokeless tobacco: Not on file  Substance Use Topics   Alcohol use: Yes    FAMILY HISTORY:   Positive for hypertension and MI in his mother.  DRUG ALLERGIES:  No Known Allergies  REVIEW OF SYSTEMS:   ROS As per history of present illness. All pertinent systems were reviewed above. Constitutional, HEENT, cardiovascular, respiratory, GI, GU, musculoskeletal, neuro, psychiatric, endocrine, integumentary and hematologic systems were reviewed and are otherwise negative/unremarkable except for positive findings mentioned above in the HPI.   MEDICATIONS AT HOME:   Prior to Admission medications   Medication Sig Start Date End Date Taking? Authorizing Provider  meloxicam (MOBIC) 15 MG tablet Take 1 tablet by mouth once daily 05/29/21  Yes   triamcinolone cream (KENALOG) 0.1 % Apply 1 application. topically 3 (three) times daily. 07/31/21  Yes [provider]  Calcipotriene-Betameth Diprop (ENSTILAR) 0.005-0.064 % FOAM Enstilar 0.005 %-0.064 % topical foam  APPLY TO AFFECTED AREA EXTERNALLY ONCE DAILY FOR 14 DAYS WHEN FLARED. Patient not taking: Reported on 05/08/2022    [provider]      VITAL SIGNS:  Blood pressure 121/79, pulse 76, temperature 99.4 F (37.4 C), temperature source Oral, resp. rate 11, height 6' (1.829 m), weight 81.6 kg, SpO2 100 %.  PHYSICAL EXAMINATION:  Physical Exam  GENERAL:  46 y.o.-year-old male patient lying in the bed with no acute distress.  EYES: Pupils equal, round, reactive to light and accommodation. No scleral icterus. Extraocular muscles intact.  HEENT: Head atraumatic, normocephalic. Oropharynx and nasopharynx clear.  NECK:  Supple, no jugular venous distention. No thyroid enlargement, no tenderness.  LUNGS: Normal breath sounds bilaterally, no wheezing, rales,rhonchi or crepitation. No use of accessory muscles of  respiration.  CARDIOVASCULAR: Regular rate and rhythm, S1, S2 normal. No murmurs, rubs, or gallops.  ABDOMEN: Soft, nondistended, with generalized lower abdominal tenderness in the infraumbilical area and mainly to the left side of his umbilicus. Bowel sounds present. No organomegaly or mass.  EXTREMITIES: No pedal edema, cyanosis, or clubbing.  NEUROLOGIC: Cranial nerves II through XII are intact. Muscle strength 5/5 in all extremities. Sensation intact. Gait not checked.  PSYCHIATRIC: The patient is alert and oriented x 3.  Normal affect and good eye contact. SKIN: No obvious rash, lesion, or ulcer.   LABORATORY PANEL:   CBC Recent Labs  Lab 05/08/22 1847  WBC 14.6*  HGB 16.9  HCT 49.6  PLT 216   ------------------------------------------------------------------------------------------------------------------  Chemistries  Recent Labs  Lab 05/08/22 1847  NA 134*  K 3.8  CL 103  CO2 26  GLUCOSE 85  BUN 12  CREATININE 0.98  CALCIUM 8.3*  AST 15  ALT 15  ALKPHOS 95  BILITOT 1.2   ------------------------------------------------------------------------------------------------------------------  Cardiac Enzymes No results for input(s): TROPONINI in the last 168 hours. ------------------------------------------------------------------------------------------------------------------  RADIOLOGY:  CT ABDOMEN PELVIS W CONTRAST  Result Date: 05/08/2022 CLINICAL DATA:  Lower abdominal pain EXAM: CT ABDOMEN AND PELVIS WITH CONTRAST TECHNIQUE: Multidetector CT imaging of the abdomen and pelvis was performed using the standard protocol following bolus administration of intravenous contrast. RADIATION DOSE REDUCTION: This exam was performed according to the departmental dose-optimization program which includes automated exposure control, adjustment of the mA and/or kV according to patient size and/or use of iterative reconstruction technique. CONTRAST:  OMNIPAQUE IOHEXOL 300  MG/ML  SOLN COMPARISON:  06/18/2011 FINDINGS: Lower chest: Lung bases are clear. Hepatobiliary: Liver is within normal limits. Gallbladder is unremarkable. No intrahepatic or extrahepatic ductal dilatation. Pancreas: Within normal limits. Spleen: Within normal limits. Adrenals/Urinary Tract: Adrenal glands are within normal limits. Bilateral renal cysts, including a 1.7 cm right upper pole renal sinus cyst (series 7/image 21). Multiple nonobstructing bilateral renal calculi, measuring up to 5 mm in the left lower pole (series 2/image 30) and 4 mm in the right lower pole (series 2/image 34). No ureteral or bladder calculi. No hydronephrosis. Bladder is within normal limits. Stomach/Bowel: Stomach is notable for a tiny hiatal hernia. No evidence of bowel obstruction. Suspected appendix is within normal limits (series 2/image 50). Thickened loop of mid ileum in the right lower abdomen (series 2/image 69) with associated blind-ending tubular pouch and surrounding inflammatory change (series 2/image 68), compatible with Meckel's diverticulitis. No drainable fluid collection/abscess. No free air to suggest macroscopic perforation. No colonic wall thickening or inflammatory changes. Vascular/Lymphatic: No evidence of abdominal aortic aneurysm. Atherosclerotic calcifications of the abdominal aorta and branch vessels. No suspicious abdominopelvic lymphadenopathy. Reproductive: Prostate is unremarkable. Other: Trace pelvic fluid. Musculoskeletal: Very mild degenerative changes at L3-4. IMPRESSION: Acute Meckel's diverticulitis. No drainable fluid collection/abscess. No free air to suggest macroscopic perforation. Surgical consultation is suggested. Additional ancillary findings as above. Electronically Signed   By: Charline Bills M.D.   On: 05/08/2022 20:15      IMPRESSION AND PLAN:  Assessment and Plan: *  Meckel's diverticulitis - The patient will be admitted to a medical telemetry bed. - We will continue  antibiotic therapy with IV Zosyn. - Pain management will be provided. - The patient will be kept n.p.o. after midnight. - General surgery consult will be obtained. - Dr. Lovell SheehanJenkins was notified and is aware about the patient.  Sepsis due to undetermined organism [facility](HCC) - This is manifested by leukocytosis and tachycardia. 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 - The patient will be hydrated with IV normal saline with added potassium chloride. - We will follow blood cultures. - The patient will be continued on IV Zosyn. - We will follow lactic acid level.  Psoriasis - We will resume his Kenalog.   DVT prophylaxis: SCDs. Advanced Care Planning:  Code Status: full code.  Family Communication:  The plan of care was discussed in details with the patient (and family). I answered all questions. The patient agreed to proceed with the above mentioned plan. Further management will depend upon hospital course. Disposition Plan: Back to previous home environment Consults called: General surgery. All the records are reviewed and case discussed with ED provider.  Status is: Inpatient  At the time of the admission, it appears that the appropriate admission status for this patient is inpatient.  This is judged to be reasonable and necessary in order to provide the required intensity of service to ensure the patient's safety given the presenting symptoms, physical exam findings and initial radiographic and laboratory data in the context of comorbid conditions.  The patient requires inpatient status due to high intensity of service, high risk of further deterioration and high frequency of surveillance required.  I certify that at the time of admission, it is my clinical judgment that the patient will require inpatient hospital care extending more than 2 midnights.                            Dispo: The patient is from: Home              Anticipated d/c is to: Home              Patient  currently is not medically stable to d/c.              Difficult to place patient: No  Hannah BeatJan A Labrittany Wechter M.D on 05/08/2022 at 11:01 PM  [facility]   From 7 PM-7 AM, contact night-coverage [email or web address]  CC: Primary care physician; Benita StabileHall, John Z, MD

## 2022-05-09 ENCOUNTER — Inpatient Hospital Stay (HOSPITAL_COMMUNITY): Payer: No Typology Code available for payment source | Admitting: Certified Registered"

## 2022-05-09 ENCOUNTER — Encounter (HOSPITAL_COMMUNITY): Payer: Self-pay | Admitting: Family Medicine

## 2022-05-09 ENCOUNTER — Encounter (HOSPITAL_COMMUNITY)
Admission: EM | Disposition: A | Discharge status: Home / Self Care | Payer: Self-pay | Source: Home / Self Care | Attending: General Surgery

## 2022-05-09 ENCOUNTER — Other Ambulatory Visit: Payer: Self-pay

## 2022-05-09 DIAGNOSIS — Q430 Meckel's diverticulum (displaced) (hypertrophic): Secondary | ICD-10-CM | POA: Diagnosis not present

## 2022-05-09 HISTORY — PX: LAPAROSCOPIC APPENDECTOMY: SHX408

## 2022-05-09 LAB — PROTIME-INR
INR: 1 (ref 0.8–1.2)
Prothrombin Time: 13.5 seconds (ref 11.4–15.2)

## 2022-05-09 LAB — CORTISOL-AM, BLOOD: Cortisol - AM: 8.8 ug/dL (ref 6.7–22.6)

## 2022-05-09 LAB — PROCALCITONIN: Procalcitonin: <0.1 ng/mL

## 2022-05-09 LAB — CBC
HCT: 46.9 % (ref 39.0–52.0)
Hemoglobin: 15.9 g/dL (ref 13.0–17.0)
MCH: 30.8 pg (ref 26.0–34.0)
MCHC: 33.9 g/dL (ref 30.0–36.0)
MCV: 90.9 fL (ref 80.0–100.0)
Platelets: 187 10*3/uL (ref 150–400)
RBC: 5.16 MIL/uL (ref 4.22–5.81)
RDW: 12.6 % (ref 11.5–15.5)
WBC: 11.8 10*3/uL — ABNORMAL HIGH (ref 4.0–10.5)
nRBC: 0 % (ref 0.0–0.2)

## 2022-05-09 LAB — BASIC METABOLIC PANEL
Anion gap: 6 (ref 5–15)
BUN: 11 mg/dL (ref 6–20)
CO2: 22 mmol/L (ref 22–32)
Calcium: 8.1 mg/dL — ABNORMAL LOW (ref 8.9–10.3)
Chloride: 109 mmol/L (ref 98–111)
Creatinine, Ser: 0.84 mg/dL (ref 0.61–1.24)
GFR, Estimated: >60 mL/min (ref >60)
Glucose, Bld: 81 mg/dL (ref 70–99)
Potassium: 4.1 mmol/L (ref 3.5–5.1)
Sodium: 137 mmol/L (ref 135–145)

## 2022-05-09 LAB — HIV ANTIBODY (ROUTINE TESTING W REFLEX): HIV Screen 4th Generation wRfx: NONREACTIVE

## 2022-05-09 SURGERY — APPENDECTOMY, LAPAROSCOPIC
Anesthesia: General | Site: Abdomen

## 2022-05-09 MED ORDER — ACETAMINOPHEN 650 MG RE SUPP: 650.0000 mg | Freq: Four times a day (QID) | RECTAL | Status: DC | PRN

## 2022-05-09 MED ORDER — ONDANSETRON HCL 4 MG/2ML IJ SOLN
INTRAMUSCULAR | Status: DC | PRN
  Administered 2022-05-09: 4 mg via INTRAVENOUS

## 2022-05-09 MED ORDER — STERILE WATER FOR IRRIGATION IR SOLN
Status: DC | PRN
  Administered 2022-05-09: 1000 mL

## 2022-05-09 MED ORDER — KETOROLAC TROMETHAMINE 30 MG/ML IJ SOLN
30.0000 mg | Freq: Four times a day (QID) | INTRAMUSCULAR | Status: AC
  Administered 2022-05-09: 30 mg via INTRAVENOUS
  Filled 2022-05-09: qty 1

## 2022-05-09 MED ORDER — SIMETHICONE 80 MG PO CHEW
40.0000 mg | CHEWABLE_TABLET | Freq: Four times a day (QID) | ORAL | Status: DC | PRN
  Administered 2022-05-10: 40 mg via ORAL
  Filled 2022-05-09: qty 1

## 2022-05-09 MED ORDER — SODIUM CHLORIDE 0.9 % IV SOLN: INTRAVENOUS | Status: DC

## 2022-05-09 MED ORDER — KETOROLAC TROMETHAMINE 30 MG/ML IJ SOLN
30.0000 mg | Freq: Four times a day (QID) | INTRAMUSCULAR | Status: DC | PRN
  Administered 2022-05-11: 30 mg via INTRAVENOUS
  Filled 2022-05-09: qty 1

## 2022-05-09 MED ORDER — ORAL CARE MOUTH RINSE: 15.0000 mL | Freq: Once | OROMUCOSAL | Status: AC

## 2022-05-09 MED ORDER — FENTANYL CITRATE (PF) 250 MCG/5ML IJ SOLN
INTRAMUSCULAR | Status: AC
  Filled 2022-05-09: qty 5

## 2022-05-09 MED ORDER — SODIUM CHLORIDE 0.9 % IR SOLN
Status: DC | PRN
  Administered 2022-05-09 (×2): 1000 mL

## 2022-05-09 MED ORDER — OXYCODONE HCL 5 MG PO TABS
5.0000 mg | ORAL_TABLET | ORAL | Status: DC | PRN
  Administered 2022-05-10 – 2022-05-11 (×4): 10 mg via ORAL
  Filled 2022-05-09 (×4): qty 2

## 2022-05-09 MED ORDER — DIPHENHYDRAMINE HCL 50 MG/ML IJ SOLN: 25.0000 mg | Freq: Four times a day (QID) | INTRAMUSCULAR | Status: DC | PRN

## 2022-05-09 MED ORDER — LIDOCAINE 2% (20 MG/ML) 5 ML SYRINGE
INTRAMUSCULAR | Status: DC | PRN
  Administered 2022-05-09: 60 mg via INTRAVENOUS

## 2022-05-09 MED ORDER — DEXAMETHASONE SODIUM PHOSPHATE 10 MG/ML IJ SOLN
INTRAMUSCULAR | Status: DC | PRN
Start: 2022-05-09 — End: 2022-05-09
  Administered 2022-05-09: 10 mg via INTRAVENOUS

## 2022-05-09 MED ORDER — HYDROMORPHONE HCL 1 MG/ML IJ SOLN
INTRAMUSCULAR | Status: AC
  Filled 2022-05-09: qty 0.5

## 2022-05-09 MED ORDER — MIDAZOLAM HCL 5 MG/5ML IJ SOLN
INTRAMUSCULAR | Status: DC | PRN
Start: 2022-05-09 — End: 2022-05-09
  Administered 2022-05-09: 2 mg via INTRAVENOUS

## 2022-05-09 MED ORDER — PROPOFOL 10 MG/ML IV BOLUS
INTRAVENOUS | Status: AC
  Filled 2022-05-09: qty 20

## 2022-05-09 MED ORDER — PROPOFOL 10 MG/ML IV BOLUS
INTRAVENOUS | Status: DC | PRN
  Administered 2022-05-09: 200 mg via INTRAVENOUS

## 2022-05-09 MED ORDER — ONDANSETRON 4 MG PO TBDP: 4.0000 mg | ORAL_TABLET | Freq: Four times a day (QID) | ORAL | Status: DC | PRN

## 2022-05-09 MED ORDER — SUGAMMADEX SODIUM 200 MG/2ML IV SOLN
INTRAVENOUS | Status: DC | PRN
  Administered 2022-05-09: 200 mg via INTRAVENOUS

## 2022-05-09 MED ORDER — ENOXAPARIN SODIUM 40 MG/0.4ML IJ SOSY
40.0000 mg | PREFILLED_SYRINGE | INTRAMUSCULAR | Status: DC
  Administered 2022-05-10 – 2022-05-13 (×4): 40 mg via SUBCUTANEOUS
  Filled 2022-05-09 (×5): qty 0.4

## 2022-05-09 MED ORDER — DEXMEDETOMIDINE HCL IN NACL 80 MCG/20ML IV SOLN
INTRAVENOUS | Status: AC
  Filled 2022-05-09: qty 20

## 2022-05-09 MED ORDER — MEPERIDINE HCL 50 MG/ML IJ SOLN: 6.2500 mg | INTRAMUSCULAR | Status: DC | PRN

## 2022-05-09 MED ORDER — CHLORHEXIDINE GLUCONATE CLOTH 2 % EX PADS
6.0000 | MEDICATED_PAD | Freq: Once | CUTANEOUS | Status: AC
  Administered 2022-05-09: 6 via TOPICAL

## 2022-05-09 MED ORDER — DEXAMETHASONE SODIUM PHOSPHATE 10 MG/ML IJ SOLN
INTRAMUSCULAR | Status: AC
  Filled 2022-05-09: qty 1

## 2022-05-09 MED ORDER — ACETAMINOPHEN 325 MG PO TABS
650.0000 mg | ORAL_TABLET | Freq: Four times a day (QID) | ORAL | Status: DC | PRN
Start: 2022-05-09 — End: 2022-05-13

## 2022-05-09 MED ORDER — LACTATED RINGERS IV SOLN
INTRAVENOUS | Status: DC
  Administered 2022-05-09: 1000 mL via INTRAVENOUS

## 2022-05-09 MED ORDER — ROCURONIUM BROMIDE 10 MG/ML (PF) SYRINGE
PREFILLED_SYRINGE | INTRAVENOUS | Status: AC
Start: 2022-05-09 — End: ?
  Filled 2022-05-09: qty 10

## 2022-05-09 MED ORDER — DIPHENHYDRAMINE HCL 25 MG PO CAPS: 25.0000 mg | ORAL_CAPSULE | Freq: Four times a day (QID) | ORAL | Status: DC | PRN

## 2022-05-09 MED ORDER — HYDROMORPHONE HCL 1 MG/ML IJ SOLN
0.2500 mg | INTRAMUSCULAR | Status: DC | PRN
  Administered 2022-05-09 (×2): 0.5 mg via INTRAVENOUS
  Filled 2022-05-09: qty 0.5

## 2022-05-09 MED ORDER — HYDROMORPHONE HCL 1 MG/ML IJ SOLN
1.0000 mg | INTRAMUSCULAR | Status: DC | PRN
  Administered 2022-05-10 (×2): 1 mg via INTRAVENOUS
  Filled 2022-05-09 (×2): qty 1

## 2022-05-09 MED ORDER — ALUM & MAG HYDROXIDE-SIMETH 200-200-20 MG/5ML PO SUSP: 30.0000 mL | Freq: Four times a day (QID) | ORAL | Status: DC | PRN

## 2022-05-09 MED ORDER — LORAZEPAM 2 MG/ML IJ SOLN
1.0000 mg | INTRAMUSCULAR | Status: DC | PRN
  Administered 2022-05-12: 1 mg via INTRAVENOUS
  Filled 2022-05-09: qty 1

## 2022-05-09 MED ORDER — CHLORHEXIDINE GLUCONATE 0.12 % MT SOLN
15.0000 mL | Freq: Once | OROMUCOSAL | Status: AC
  Administered 2022-05-09: 15 mL via OROMUCOSAL

## 2022-05-09 MED ORDER — ONDANSETRON HCL 4 MG/2ML IJ SOLN
4.0000 mg | Freq: Four times a day (QID) | INTRAMUSCULAR | Status: DC | PRN
  Administered 2022-05-12: 4 mg via INTRAVENOUS
  Filled 2022-05-09: qty 2

## 2022-05-09 MED ORDER — MIDAZOLAM HCL 2 MG/2ML IJ SOLN
INTRAMUSCULAR | Status: AC
  Filled 2022-05-09: qty 2

## 2022-05-09 MED ORDER — LIDOCAINE HCL (PF) 2 % IJ SOLN
INTRAMUSCULAR | Status: AC
  Filled 2022-05-09: qty 5

## 2022-05-09 MED ORDER — ROCURONIUM BROMIDE 10 MG/ML (PF) SYRINGE
PREFILLED_SYRINGE | INTRAVENOUS | Status: DC | PRN
  Administered 2022-05-09: 20 mg via INTRAVENOUS
  Administered 2022-05-09: 60 mg via INTRAVENOUS

## 2022-05-09 MED ORDER — CHLORHEXIDINE GLUCONATE CLOTH 2 % EX PADS: 6.0000 | MEDICATED_PAD | Freq: Once | CUTANEOUS | Status: DC

## 2022-05-09 MED ORDER — FENTANYL CITRATE (PF) 250 MCG/5ML IJ SOLN
INTRAMUSCULAR | Status: DC | PRN
  Administered 2022-05-09: 50 ug via INTRAVENOUS
  Administered 2022-05-09: 100 ug via INTRAVENOUS
  Administered 2022-05-09 (×2): 50 ug via INTRAVENOUS

## 2022-05-09 MED ORDER — BUPIVACAINE LIPOSOME 1.3 % IJ SUSP
INTRAMUSCULAR | Status: DC | PRN
  Administered 2022-05-09: 20 mL

## 2022-05-09 MED ORDER — DEXMEDETOMIDINE HCL IN NACL 400 MCG/100ML IV SOLN
INTRAVENOUS | Status: DC | PRN
  Administered 2022-05-09: 8 ug via INTRAVENOUS
  Administered 2022-05-09 (×2): 4 ug via INTRAVENOUS

## 2022-05-09 MED ORDER — ONDANSETRON HCL 4 MG/2ML IJ SOLN
INTRAMUSCULAR | Status: AC
Start: 2022-05-09 — End: ?
  Filled 2022-05-09: qty 2

## 2022-05-09 MED ORDER — PIPERACILLIN-TAZOBACTAM 3.375 G IVPB
3.3750 g | Freq: Three times a day (TID) | INTRAVENOUS | Status: DC
  Administered 2022-05-09 – 2022-05-13 (×11): 3.375 g via INTRAVENOUS
  Filled 2022-05-09 (×17): qty 50

## 2022-05-09 SURGICAL SUPPLY — 62 items
ADH SKN CLS APL DERMABOND .7 (GAUZE/BANDAGES/DRESSINGS) ×1
APL PRP STRL LF DISP 70% ISPRP (MISCELLANEOUS) ×1
BAG RETRIEVAL 10 (BASKET) ×1
BLADE SURG SZ10 CARB STEEL (BLADE) ×1 IMPLANT
CHLORAPREP W/TINT 26 (MISCELLANEOUS) ×2 IMPLANT
CLOTH BEACON ORANGE TIMEOUT ST (SAFETY) ×2 IMPLANT
COVER LIGHT HANDLE STERIS (MISCELLANEOUS) ×4 IMPLANT
CUTTER FLEX LINEAR 45M (STAPLE) ×2 IMPLANT
DERMABOND ADVANCED (GAUZE/BANDAGES/DRESSINGS) ×1
DERMABOND ADVANCED .7 DNX12 (GAUZE/BANDAGES/DRESSINGS) ×1 IMPLANT
DRSG OPSITE POSTOP 4X10 (GAUZE/BANDAGES/DRESSINGS) ×1 IMPLANT
ELECT REM PT RETURN 9FT ADLT (ELECTROSURGICAL) ×2
ELECTRODE REM PT RTRN 9FT ADLT (ELECTROSURGICAL) ×1 IMPLANT
GLOVE BIO SURGEON STRL SZ 6.5 (GLOVE) ×1 IMPLANT
GLOVE BIOGEL PI IND STRL 7.0 (GLOVE) ×2 IMPLANT
GLOVE BIOGEL PI INDICATOR 7.0 (GLOVE) ×2
GLOVE SS BIOGEL STRL SZ 6.5 (GLOVE) IMPLANT
GLOVE SUPERSENSE BIOGEL SZ 6.5 (GLOVE) ×3
GLOVE SURG SS PI 7.5 STRL IVOR (GLOVE) ×3 IMPLANT
GOWN STRL REUS W/TWL LRG LVL3 (GOWN DISPOSABLE) ×4 IMPLANT
HANDLE SUCTION POOLE (INSTRUMENTS) IMPLANT
INST SET LAPROSCOPIC AP (KITS) ×2 IMPLANT
KIT TURNOVER KIT A (KITS) ×2 IMPLANT
LIGASURE IMPACT 36 18CM CVD LR (INSTRUMENTS) ×1 IMPLANT
MANIFOLD NEPTUNE II (INSTRUMENTS) ×2 IMPLANT
NDL HYPO 21X1.5 SAFETY (NEEDLE) ×1 IMPLANT
NDL INSUFFLATION 14GA 120MM (NEEDLE) ×1 IMPLANT
NEEDLE HYPO 21X1.5 SAFETY (NEEDLE) ×2 IMPLANT
NEEDLE INSUFFLATION 14GA 120MM (NEEDLE) ×2 IMPLANT
NS IRRIG 1000ML POUR BTL (IV SOLUTION) ×3 IMPLANT
PACK LAP CHOLE LZT030E (CUSTOM PROCEDURE TRAY) ×2 IMPLANT
PAD ARMBOARD 7.5X6 YLW CONV (MISCELLANEOUS) ×2 IMPLANT
PENCIL SMOKE EVACUATOR COATED (MISCELLANEOUS) ×1 IMPLANT
RELOAD PROXIMATE 75MM BLUE (ENDOMECHANICALS) ×4 IMPLANT
RELOAD STAPLE 75 3.8 BLU REG (ENDOMECHANICALS) IMPLANT
RETRACTOR WND ALEXIS-O 25 LRG (MISCELLANEOUS) IMPLANT
RTRCTR WOUND ALEXIS O 25CM LRG (MISCELLANEOUS) ×2
SET BASIN LINEN APH (SET/KITS/TRAYS/PACK) ×2 IMPLANT
SET TUBE IRRIG SUCTION NO TIP (IRRIGATION / IRRIGATOR) ×2 IMPLANT
SHEARS HARMONIC ACE PLUS 36CM (ENDOMECHANICALS) ×2 IMPLANT
SPONGE T-LAP 18X18 ~~LOC~~+RFID (SPONGE) ×1 IMPLANT
STAPLER GUN LINEAR PROX 60 (STAPLE) ×1 IMPLANT
STAPLER PROXIMATE 75MM BLUE (STAPLE) ×1 IMPLANT
STAPLER VISISTAT (STAPLE) ×1 IMPLANT
STAPLER VISISTAT 35W (STAPLE) ×1 IMPLANT
SUCTION POOLE HANDLE (INSTRUMENTS) ×2
SUT CHROMIC 2 0 SH (SUTURE) ×1 IMPLANT
SUT MNCRL AB 4-0 PS2 18 (SUTURE) ×4 IMPLANT
SUT PDS AB 0 CTX 60 (SUTURE) ×4 IMPLANT
SUT SILK 3 0 SH CR/8 (SUTURE) ×1 IMPLANT
SUT VICRYL 0 UR6 27IN ABS (SUTURE) ×2 IMPLANT
SYR 20ML LL LF (SYRINGE) ×4 IMPLANT
SYS BAG RETRIEVAL 10MM (BASKET) ×1
SYSTEM BAG RETRIEVAL 10MM (BASKET) ×1 IMPLANT
TRAY FOLEY W/BAG SLVR 16FR (SET/KITS/TRAYS/PACK) ×2
TRAY FOLEY W/BAG SLVR 16FR ST (SET/KITS/TRAYS/PACK) ×1 IMPLANT
TROCAR Z-THAD FIOS HNDL 12X100 (TROCAR) ×2 IMPLANT
TROCAR Z-THRD FIOS HNDL 11X100 (TROCAR) ×2 IMPLANT
TROCAR Z-THREAD FIOS 5X100MM (TROCAR) ×2 IMPLANT
TUBE CONNECTING 12X1/4 (SUCTIONS) ×2 IMPLANT
WARMER LAPAROSCOPE (MISCELLANEOUS) ×2 IMPLANT
WATER STERILE IRR 1000ML POUR (IV SOLUTION) ×1 IMPLANT

## 2022-05-09 NOTE — Progress Notes (Signed)
  Transition of Care [facility]) Screening Note   Patient Details  Name: Kenneth Warren Date of Birth: Nov 25, 1976   Transition of Care [facility]) CM/SW Contact:    Iona Beard, [place] Phone Number: 05/09/2022, 11:06 AM    Transition of Care Department [facility]) has reviewed patient and no TOC needs have been identified at this time. We will continue to monitor patient advancement through interdisciplinary progression rounds. If new patient transition needs arise, please place a TOC consult.

## 2022-05-09 NOTE — Consult Note (Addendum)
Reason for Consult: Meckel's diverticulitis Referring Physician: Dr. Darlin Coco is an 46 y.o. male.  HPI: Patient is a 47 year old white male who presented to the emergency room with a 3-day history of worsening lower abdominal pain.  CT scan of the abdomen reveals Meckel's diverticulitis without abscess or perforation.  He denies any fever or chills.  He does have generalized malaise and a decreased appetite.  Past Medical History:  Diagnosis Date   Kidney stones     Past Surgical History:  Procedure Laterality Date   SHOULDER SURGERY Right    URETERAL STENT PLACEMENT      History reviewed. No pertinent family history.  Social History:  reports that he has been smoking. He does not have any smokeless tobacco history on file. He reports current alcohol use. He reports that he does not use drugs.  Allergies: No Known Allergies  Medications: I have reviewed the patient's current medications. Prior to Admission:  Medications Prior to Admission  Medication Sig Dispense Refill Last Dose   meloxicam (MOBIC) 15 MG tablet Take 1 tablet by mouth once daily 30 tablet 2 unknown   triamcinolone cream (KENALOG) 0.1 % Apply 1 application. topically 3 (three) times daily.   unknown   Calcipotriene-Betameth Diprop (ENSTILAR) 0.005-0.064 % FOAM Enstilar 0.005 %-0.064 % topical foam  APPLY TO AFFECTED AREA EXTERNALLY ONCE DAILY FOR 14 DAYS WHEN FLARED. (Patient not taking: Reported on 05/08/2022)   Not Taking    Results for orders placed or performed during the hospital encounter of 05/08/22 (from the past 48 hour(s))  Urinalysis, Routine w reflex microscopic Urine, Clean Catch     Status: Abnormal   Collection Time: 05/08/22  4:26 PM  Result Value Ref Range   Color, Urine YELLOW YELLOW   APPearance CLEAR CLEAR   Specific Gravity, Urine 1.018 1.005 - 1.030   pH 6.0 5.0 - 8.0   Glucose, UA NEGATIVE NEGATIVE mg/dL   Hgb urine dipstick SMALL (A) NEGATIVE   Bilirubin Urine  NEGATIVE NEGATIVE   Ketones, ur NEGATIVE NEGATIVE mg/dL   Protein, ur NEGATIVE NEGATIVE mg/dL   Nitrite NEGATIVE NEGATIVE   Leukocytes,Ua NEGATIVE NEGATIVE   RBC / HPF 0-5 0 - 5 RBC/hpf   WBC, UA 0-5 0 - 5 WBC/hpf   Bacteria, UA NONE SEEN NONE SEEN   Squamous Epithelial / LPF 0-5 0 - 5   Mucus PRESENT     Comment: Performed at [facility], [address], [place], [place] [address]  Comprehensive metabolic panel     Status: Abnormal   Collection Time: 05/08/22  6:47 PM  Result Value Ref Range   Sodium 134 (L) 135 - 145 mmol/L   Potassium 3.8 3.5 - 5.1 mmol/L   Chloride 103 98 - 111 mmol/L   CO2 26 22 - 32 mmol/L   Glucose, Bld 85 70 - 99 mg/dL    Comment: Glucose reference range applies only to samples taken after fasting for at least 8 hours.   BUN 12 6 - 20 mg/dL   Creatinine, Ser 0.98 0.61 - 1.24 mg/dL   Calcium 8.3 (L) 8.9 - 10.3 mg/dL   Total Protein 6.9 6.5 - 8.1 g/dL   Albumin 3.8 3.5 - 5.0 g/dL   AST 15 15 - 41 U/L   ALT 15 0 - 44 U/L   Alkaline Phosphatase 95 38 - 126 U/L   Total Bilirubin 1.2 0.3 - 1.2 mg/dL   GFR, Estimated >60 >60 mL/min    Comment: (  NOTE) Calculated using the CKD-EPI Creatinine Equation (2021)    Anion gap 5 5 - 15    Comment: Performed at [facility], [address] [place], [place] [address]  CBC with Differential     Status: Abnormal   Collection Time: 05/08/22  6:47 PM  Result Value Ref Range   WBC 14.6 (H) 4.0 - 10.5 K/uL   RBC 5.50 4.22 - 5.81 MIL/uL   Hemoglobin 16.9 13.0 - 17.0 g/dL   HCT 49.6 39.0 - 52.0 %   MCV 90.2 80.0 - 100.0 fL   MCH 30.7 26.0 - 34.0 pg   MCHC 34.1 30.0 - 36.0 g/dL   RDW 12.5 11.5 - 15.5 %   Platelets 216 150 - 400 K/uL   nRBC 0.0 0.0 - 0.2 %   Neutrophils Relative % 74 %   Neutro Abs 11.0 (H) 1.7 - 7.7 K/uL   Lymphocytes Relative 12 %   Lymphs Abs 1.7 0.7 - 4.0 K/uL   Monocytes Relative 10 %   Monocytes Absolute 1.4 (H) 0.1 - 1.0 K/uL   Eosinophils Relative 3 %   Eosinophils Absolute 0.4 0.0 - 0.5  K/uL   Basophils Relative 1 %   Basophils Absolute 0.1 0.0 - 0.1 K/uL   Immature Granulocytes 0 %   Abs Immature Granulocytes 0.03 0.00 - 0.07 K/uL    Comment: Performed at [facility], [address], [place], [place] [address]  Protime-INR     Status: None   Collection Time: 05/09/22  4:27 AM  Result Value Ref Range   Prothrombin Time 13.5 11.4 - 15.2 seconds   INR 1.0 0.8 - 1.2    Comment: (NOTE) INR goal varies based on device and disease states. Performed at [facility], [address], [place], [place] [address]   Procalcitonin     Status: None   Collection Time: 05/09/22  4:27 AM  Result Value Ref Range   Procalcitonin <0.10 ng/mL    Comment:        Interpretation: PCT (Procalcitonin) <= 0.5 ng/mL: Systemic infection (sepsis) is not likely. Local bacterial infection is possible. (NOTE)       Sepsis PCT Algorithm           Lower Respiratory Tract                                      Infection PCT Algorithm    ----------------------------     ----------------------------         PCT < 0.25 ng/mL                PCT < 0.10 ng/mL          Strongly encourage             Strongly discourage   discontinuation of antibiotics    initiation of antibiotics    ----------------------------     -----------------------------       PCT 0.25 - 0.50 ng/mL            PCT 0.10 - 0.25 ng/mL               OR       >80% decrease in PCT            Discourage initiation of  antibiotics      Encourage discontinuation           of antibiotics    ----------------------------     -----------------------------         PCT >= 0.50 ng/mL              PCT 0.26 - 0.50 ng/mL               AND        <80% decrease in PCT             Encourage initiation of                                             antibiotics       Encourage continuation           of antibiotics    ----------------------------     -----------------------------        PCT >= 0.50 ng/mL                   PCT > 0.50 ng/mL               AND         increase in PCT                  Strongly encourage                                      initiation of antibiotics    Strongly encourage escalation           of antibiotics                                     -----------------------------                                           PCT <= 0.25 ng/mL                                                 OR                                        > 80% decrease in PCT                                      Discontinue / Do not initiate                                             antibiotics  Performed at [facility], [address] [place], [place] [id number]   Basic metabolic panel     Status: Abnormal   Collection Time:  05/09/22  4:27 AM  Result Value Ref Range   Sodium 137 135 - 145 mmol/L   Potassium 4.1 3.5 - 5.1 mmol/L   Chloride 109 98 - 111 mmol/L   CO2 22 22 - 32 mmol/L   Glucose, Bld 81 70 - 99 mg/dL    Comment: Glucose reference range applies only to samples taken after fasting for at least 8 hours.   BUN 11 6 - 20 mg/dL   Creatinine, Ser 0.84 0.61 - 1.24 mg/dL   Calcium 8.1 (L) 8.9 - 10.3 mg/dL   GFR, Estimated >60 >60 mL/min    Comment: (NOTE) Calculated using the CKD-EPI Creatinine Equation (2021)    Anion gap 6 5 - 15    Comment: Performed at [facility], [address] [place], [place] [address]  CBC     Status: Abnormal   Collection Time: 05/09/22  4:27 AM  Result Value Ref Range   WBC 11.8 (H) 4.0 - 10.5 K/uL   RBC 5.16 4.22 - 5.81 MIL/uL   Hemoglobin 15.9 13.0 - 17.0 g/dL   HCT 46.9 39.0 - 52.0 %   MCV 90.9 80.0 - 100.0 fL   MCH 30.8 26.0 - 34.0 pg   MCHC 33.9 30.0 - 36.0 g/dL   RDW 12.6 11.5 - 15.5 %   Platelets 187 150 - 400 K/uL   nRBC 0.0 0.0 - 0.2 %    Comment: Performed at [facility], [address] [place], [place] [address]    CT ABDOMEN PELVIS W CONTRAST  Result Date: 05/08/2022 CLINICAL DATA:  Lower abdominal pain EXAM: CT ABDOMEN AND PELVIS  WITH CONTRAST TECHNIQUE: Multidetector CT imaging of the abdomen and pelvis was performed using the standard protocol following bolus administration of intravenous contrast. RADIATION DOSE REDUCTION: This exam was performed according to the departmental dose-optimization program which includes automated exposure control, adjustment of the mA and/or kV according to patient size and/or use of iterative reconstruction technique. CONTRAST:  154mL OMNIPAQUE IOHEXOL 300 MG/ML  SOLN COMPARISON:  06/18/2011 FINDINGS: Lower chest: Lung bases are clear. Hepatobiliary: Liver is within normal limits. Gallbladder is unremarkable. No intrahepatic or extrahepatic ductal dilatation. Pancreas: Within normal limits. Spleen: Within normal limits. Adrenals/Urinary Tract: Adrenal glands are within normal limits. Bilateral renal cysts, including a 1.7 cm right upper pole renal sinus cyst (series 7/image 21). Multiple nonobstructing bilateral renal calculi, measuring up to 5 mm in the left lower pole (series 2/image 30) and 4 mm in the right lower pole (series 2/image 34). No ureteral or bladder calculi. No hydronephrosis. Bladder is within normal limits. Stomach/Bowel: Stomach is notable for a tiny hiatal hernia. No evidence of bowel obstruction. Suspected appendix is within normal limits (series 2/image 50). Thickened loop of mid ileum in the right lower abdomen (series 2/image 69) with associated blind-ending tubular pouch and surrounding inflammatory change (series 2/image 68), compatible with Meckel's diverticulitis. No drainable fluid collection/abscess. No free air to suggest macroscopic perforation. No colonic wall thickening or inflammatory changes. Vascular/Lymphatic: No evidence of abdominal aortic aneurysm. Atherosclerotic calcifications of the abdominal aorta and branch vessels. No suspicious abdominopelvic lymphadenopathy. Reproductive: Prostate is unremarkable. Other: Trace pelvic fluid. Musculoskeletal: Very mild  degenerative changes at L3-4. IMPRESSION: Acute Meckel's diverticulitis. No drainable fluid collection/abscess. No free air to suggest macroscopic perforation. Surgical consultation is suggested. Additional ancillary findings as above. Electronically Signed   By: Julian Hy M.D.   On: 05/08/2022 20:15    ROS:  Pertinent items are noted in HPI.  Blood  pressure 107/69, pulse 66, temperature (!) 97 F (36.1 C), resp. rate 19, height 6' (1.829 m), weight 79.5 kg, SpO2 99 %. Physical Exam: Well-developed and well-nourished white male no acute distress Head is normocephalic, atraumatic Lungs clear to auscultation with equal breath sounds bilaterally Heart examination reveals regular rate and rhythm without S3, S4, murmurs Abdomen soft with tenderness noted in the suprapubic region.  No rigidity is noted.  CT scan images personally reviewed  Assessment/Plan: Impression: Meckel's diverticulitis Plan: Patient be taken to the operating room for laparoscopic Meckel's diverticulectomy, possible open.  The risks and benefits of the procedure including bleeding, infection, and the possibility of a partial small bowel resection were fully explained to the patient, who gave informed consent.  IV antibiotics have already been ordered.  Will transfer patient to my service.  Aviva Signs 05/09/2022, 8:28 AM

## 2022-05-09 NOTE — Anesthesia Preprocedure Evaluation (Signed)
Anesthesia Evaluation  Patient identified by MRN, date of birth, ID band Patient awake    Reviewed: Allergy & Precautions, NPO status , Patient's Chart, lab work & pertinent test results  Airway Mallampati: II  TM Distance: >3 FB Neck ROM: Full    Dental  (+) Dental Advisory Given, Teeth Intact   Pulmonary Current Smoker and Patient abstained from smoking.,    Pulmonary exam normal breath sounds clear to auscultation       Cardiovascular negative cardio ROS Normal cardiovascular exam Rhythm:Regular Rate:Normal     Neuro/Psych negative neurological ROS  negative psych ROS   GI/Hepatic negative GI ROS, Neg liver ROS,   Endo/Other  negative endocrine ROS  Renal/GU Renal disease  negative genitourinary   Musculoskeletal negative musculoskeletal ROS (+)   Abdominal   Peds negative pediatric ROS (+)  Hematology negative hematology ROS (+)   Anesthesia Other Findings   Reproductive/Obstetrics negative OB ROS                            Anesthesia Physical Anesthesia Plan  ASA: 2  Anesthesia Plan: General   Post-op Pain Management: Dilaudid IV   Induction: Intravenous  PONV Risk Score and Plan: 3 and Ondansetron, Dexamethasone and Midazolam  Airway Management Planned: Oral ETT  Additional Equipment:   Intra-op Plan:   Post-operative Plan: Extubation in OR  Informed Consent: I have reviewed the patients History and Physical, chart, labs and discussed the procedure including the risks, benefits and alternatives for the proposed anesthesia with the patient or authorized representative who has indicated his/her understanding and acceptance.     Dental advisory given  Plan Discussed with: CRNA and Surgeon  Anesthesia Plan Comments:         Anesthesia Quick Evaluation

## 2022-05-09 NOTE — Transfer of Care (Signed)
Immediate Anesthesia Transfer of Care Note  Patient: Kenneth Warren  Procedure(s) Performed: Meckel's diverticulectomy Open, attempted Laparoscopic (Abdomen)  Patient Location: PACU  Anesthesia Type:General  Level of Consciousness: awake, alert , oriented and patient cooperative  Airway & Oxygen Therapy: Patient Spontanous Breathing and Patient connected to nasal cannula oxygen  Post-op Assessment: Report given to RN, Post -op Vital signs reviewed and stable and Patient moving all extremities  Post vital signs: Reviewed and stable  Last Vitals:  Vitals Value Taken Time  BP 113/69 05/09/22 1500  Temp 36.3 C 05/09/22 1500  Pulse 91 05/09/22 1504  Resp 18 05/09/22 1504  SpO2 92 % 05/09/22 1504  Vitals shown include unvalidated device data.  Last Pain:  Vitals:   05/09/22 1253  TempSrc: Oral  PainSc: 0-No pain      Patients Stated Pain Goal: 6 (05/09/22 1253)  Complications: No notable events documented.

## 2022-05-09 NOTE — H&P (View-Only) (Signed)
Reason for Consult: Meckel's diverticulitis Referring Physician: Dr. Darlin Coco is an 46 y.o. male.  HPI: Patient is a 46 year old white male who presented to the emergency room with a 3-day history of worsening lower abdominal pain.  CT scan of the abdomen reveals Meckel's diverticulitis without abscess or perforation.  He denies any fever or chills.  He does have generalized malaise and a decreased appetite.  Past Medical History:  Diagnosis Date   Kidney stones     Past Surgical History:  Procedure Laterality Date   SHOULDER SURGERY Right    URETERAL STENT PLACEMENT      History reviewed. No pertinent family history.  Social History:  reports that he has been smoking. He does not have any smokeless tobacco history on file. He reports current alcohol use. He reports that he does not use drugs.  Allergies: No Known Allergies  Medications: I have reviewed the patient's current medications. Prior to Admission:  Medications Prior to Admission  Medication Sig Dispense Refill Last Dose   meloxicam (MOBIC) 15 MG tablet Take 1 tablet by mouth once daily 30 tablet 2 unknown   triamcinolone cream (KENALOG) 0.1 % Apply 1 application. topically 3 (three) times daily.   unknown   Calcipotriene-Betameth Diprop (ENSTILAR) 0.005-0.064 % FOAM Enstilar 0.005 %-0.064 % topical foam  APPLY TO AFFECTED AREA EXTERNALLY ONCE DAILY FOR 14 DAYS WHEN FLARED. (Patient not taking: Reported on 05/08/2022)   Not Taking    Results for orders placed or performed during the hospital encounter of 05/08/22 (from the past 48 hour(s))  Urinalysis, Routine w reflex microscopic Urine, Clean Catch     Status: Abnormal   Collection Time: 05/08/22  4:26 PM  Result Value Ref Range   Color, Urine YELLOW YELLOW   APPearance CLEAR CLEAR   Specific Gravity, Urine 1.018 1.005 - 1.030   pH 6.0 5.0 - 8.0   Glucose, UA NEGATIVE NEGATIVE mg/dL   Hgb urine dipstick SMALL (A) NEGATIVE   Bilirubin Urine  NEGATIVE NEGATIVE   Ketones, ur NEGATIVE NEGATIVE mg/dL   Protein, ur NEGATIVE NEGATIVE mg/dL   Nitrite NEGATIVE NEGATIVE   Leukocytes,Ua NEGATIVE NEGATIVE   RBC / HPF 0-5 0 - 5 RBC/hpf   WBC, UA 0-5 0 - 5 WBC/hpf   Bacteria, UA NONE SEEN NONE SEEN   Squamous Epithelial / LPF 0-5 0 - 5   Mucus PRESENT     Comment: Performed at [facility], [address] [place], [place] [address]  Comprehensive metabolic panel     Status: Abnormal   Collection Time: 05/08/22  6:47 PM  Result Value Ref Range   Sodium 134 (L) 135 - 145 mmol/L   Potassium 3.8 3.5 - 5.1 mmol/L   Chloride 103 98 - 111 mmol/L   CO2 26 22 - 32 mmol/L   Glucose, Bld 85 70 - 99 mg/dL    Comment: Glucose reference range applies only to samples taken after fasting for at least 8 hours.   BUN 12 6 - 20 mg/dL   Creatinine, Ser 0.98 0.61 - 1.24 mg/dL   Calcium 8.3 (L) 8.9 - 10.3 mg/dL   Total Protein 6.9 6.5 - 8.1 g/dL   Albumin 3.8 3.5 - 5.0 g/dL   AST 15 15 - 41 U/L   ALT 15 0 - 44 U/L   Alkaline Phosphatase 95 38 - 126 U/L   Total Bilirubin 1.2 0.3 - 1.2 mg/dL   GFR, Estimated >60 >60 mL/min    Comment: (  NOTE) Calculated using the CKD-EPI Creatinine Equation (2021)    Anion gap 5 5 - 15    Comment: Performed at [facility], [address] [place], [place] [address]  CBC with Differential     Status: Abnormal   Collection Time: 05/08/22  6:47 PM  Result Value Ref Range   WBC 14.6 (H) 4.0 - 10.5 K/uL   RBC 5.50 4.22 - 5.81 MIL/uL   Hemoglobin 16.9 13.0 - 17.0 g/dL   HCT 49.6 39.0 - 52.0 %   MCV 90.2 80.0 - 100.0 fL   MCH 30.7 26.0 - 34.0 pg   MCHC 34.1 30.0 - 36.0 g/dL   RDW 12.5 11.5 - 15.5 %   Platelets 216 150 - 400 K/uL   nRBC 0.0 0.0 - 0.2 %   Neutrophils Relative % 74 %   Neutro Abs 11.0 (H) 1.7 - 7.7 K/uL   Lymphocytes Relative 12 %   Lymphs Abs 1.7 0.7 - 4.0 K/uL   Monocytes Relative 10 %   Monocytes Absolute 1.4 (H) 0.1 - 1.0 K/uL   Eosinophils Relative 3 %   Eosinophils Absolute 0.4 0.0 - 0.5  K/uL   Basophils Relative 1 %   Basophils Absolute 0.1 0.0 - 0.1 K/uL   Immature Granulocytes 0 %   Abs Immature Granulocytes 0.03 0.00 - 0.07 K/uL    Comment: Performed at [facility], [address], [place], [place] [address]  Protime-INR     Status: None   Collection Time: 05/09/22  4:27 AM  Result Value Ref Range   Prothrombin Time 13.5 11.4 - 15.2 seconds   INR 1.0 0.8 - 1.2    Comment: (NOTE) INR goal varies based on device and disease states. Performed at [facility], [address], [place], [place] [address]   Procalcitonin     Status: None   Collection Time: 05/09/22  4:27 AM  Result Value Ref Range   Procalcitonin <0.10 ng/mL    Comment:        Interpretation: PCT (Procalcitonin) <= 0.5 ng/mL: Systemic infection (sepsis) is not likely. Local bacterial infection is possible. (NOTE)       Sepsis PCT Algorithm           Lower Respiratory Tract                                      Infection PCT Algorithm    ----------------------------     ----------------------------         PCT < 0.25 ng/mL                PCT < 0.10 ng/mL          Strongly encourage             Strongly discourage   discontinuation of antibiotics    initiation of antibiotics    ----------------------------     -----------------------------       PCT 0.25 - 0.50 ng/mL            PCT 0.10 - 0.25 ng/mL               OR       >80% decrease in PCT            Discourage initiation of  antibiotics      Encourage discontinuation           of antibiotics    ----------------------------     -----------------------------         PCT >= 0.50 ng/mL              PCT 0.26 - 0.50 ng/mL               AND        <80% decrease in PCT             Encourage initiation of                                             antibiotics       Encourage continuation           of antibiotics    ----------------------------     -----------------------------        PCT >= 0.50 ng/mL                   PCT > 0.50 ng/mL               AND         increase in PCT                  Strongly encourage                                      initiation of antibiotics    Strongly encourage escalation           of antibiotics                                     -----------------------------                                           PCT <= 0.25 ng/mL                                                 OR                                        > 80% decrease in PCT                                      Discontinue / Do not initiate                                             antibiotics  Performed at [facility], [address], [place], [place] [id number]   Basic metabolic panel     Status: Abnormal   Collection Time:  05/09/22  4:27 AM  Result Value Ref Range   Sodium 137 135 - 145 mmol/L   Potassium 4.1 3.5 - 5.1 mmol/L   Chloride 109 98 - 111 mmol/L   CO2 22 22 - 32 mmol/L   Glucose, Bld 81 70 - 99 mg/dL    Comment: Glucose reference range applies only to samples taken after fasting for at least 8 hours.   BUN 11 6 - 20 mg/dL   Creatinine, Ser 0.84 0.61 - 1.24 mg/dL   Calcium 8.1 (L) 8.9 - 10.3 mg/dL   GFR, Estimated >60 >60 mL/min    Comment: (NOTE) Calculated using the CKD-EPI Creatinine Equation (2021)    Anion gap 6 5 - 15    Comment: Performed at [facility], [address] [place], [place] [address]  CBC     Status: Abnormal   Collection Time: 05/09/22  4:27 AM  Result Value Ref Range   WBC 11.8 (H) 4.0 - 10.5 K/uL   RBC 5.16 4.22 - 5.81 MIL/uL   Hemoglobin 15.9 13.0 - 17.0 g/dL   HCT 46.9 39.0 - 52.0 %   MCV 90.9 80.0 - 100.0 fL   MCH 30.8 26.0 - 34.0 pg   MCHC 33.9 30.0 - 36.0 g/dL   RDW 12.6 11.5 - 15.5 %   Platelets 187 150 - 400 K/uL   nRBC 0.0 0.0 - 0.2 %    Comment: Performed at [facility], [address] [place],  [address]    CT ABDOMEN PELVIS W CONTRAST  Result Date: 05/08/2022 CLINICAL DATA:  Lower abdominal pain EXAM: CT ABDOMEN AND PELVIS  WITH CONTRAST TECHNIQUE: Multidetector CT imaging of the abdomen and pelvis was performed using the standard protocol following bolus administration of intravenous contrast. RADIATION DOSE REDUCTION: This exam was performed according to the departmental dose-optimization program which includes automated exposure control, adjustment of the mA and/or kV according to patient size and/or use of iterative reconstruction technique. CONTRAST:  166mL OMNIPAQUE IOHEXOL 300 MG/ML  SOLN COMPARISON:  06/18/2011 FINDINGS: Lower chest: Lung bases are clear. Hepatobiliary: Liver is within normal limits. Gallbladder is unremarkable. No intrahepatic or extrahepatic ductal dilatation. Pancreas: Within normal limits. Spleen: Within normal limits. Adrenals/Urinary Tract: Adrenal glands are within normal limits. Bilateral renal cysts, including a 1.7 cm right upper pole renal sinus cyst (series 7/image 21). Multiple nonobstructing bilateral renal calculi, measuring up to 5 mm in the left lower pole (series 2/image 30) and 4 mm in the right lower pole (series 2/image 34). No ureteral or bladder calculi. No hydronephrosis. Bladder is within normal limits. Stomach/Bowel: Stomach is notable for a tiny hiatal hernia. No evidence of bowel obstruction. Suspected appendix is within normal limits (series 2/image 50). Thickened loop of mid ileum in the right lower abdomen (series 2/image 69) with associated blind-ending tubular pouch and surrounding inflammatory change (series 2/image 68), compatible with Meckel's diverticulitis. No drainable fluid collection/abscess. No free air to suggest macroscopic perforation. No colonic wall thickening or inflammatory changes. Vascular/Lymphatic: No evidence of abdominal aortic aneurysm. Atherosclerotic calcifications of the abdominal aorta and branch vessels. No suspicious abdominopelvic lymphadenopathy. Reproductive: Prostate is unremarkable. Other: Trace pelvic fluid. Musculoskeletal: Very mild  degenerative changes at L3-4. IMPRESSION: Acute Meckel's diverticulitis. No drainable fluid collection/abscess. No free air to suggest macroscopic perforation. Surgical consultation is suggested. Additional ancillary findings as above. Electronically Signed   By: Julian Hy M.D.   On: 05/08/2022 20:15    ROS:  Pertinent items are noted in HPI.  Blood  pressure 107/69, pulse 66, temperature (!) 97 F (36.1 C), resp. rate 19, height 6' (1.829 m), weight 79.5 kg, SpO2 99 %. Physical Exam: Well-developed and well-nourished white male no acute distress Head is normocephalic, atraumatic Lungs clear to auscultation with equal breath sounds bilaterally Heart examination reveals regular rate and rhythm without S3, S4, murmurs Abdomen soft with tenderness noted in the suprapubic region.  No rigidity is noted.  CT scan images personally reviewed  Assessment/Plan: Impression: Meckel's diverticulitis Plan: Patient be taken to the operating room for laparoscopic Meckel's diverticulectomy, possible open.  The risks and benefits of the procedure including bleeding, infection, and the possibility of a partial small bowel resection were fully explained to the patient, who gave informed consent.  IV antibiotics have already been ordered.  Will transfer patient to my service.  Aviva Signs 05/09/2022, 8:28 AM

## 2022-05-09 NOTE — Anesthesia Postprocedure Evaluation (Signed)
Anesthesia Post Note  Patient: Kenneth Warren  Procedure(s) Performed: Meckel's diverticulectomy Open, attempted Laparoscopic (Abdomen)  Patient location during evaluation: PACU Anesthesia Type: General Level of consciousness: awake and alert and oriented Pain management: pain level controlled Vital Signs Assessment: post-procedure vital signs reviewed and stable Respiratory status: spontaneous breathing, nonlabored ventilation and respiratory function stable Cardiovascular status: blood pressure returned to baseline and stable Postop Assessment: no apparent nausea or vomiting Anesthetic complications: no   No notable events documented.   Last Vitals:  Vitals:   05/09/22 1500 05/09/22 1530  BP: 113/69 105/67  Pulse: 95 95  Resp: 12 18  Temp: (!) 36.3 C   SpO2: 97% 90%    Last Pain:  Vitals:   05/09/22 1530  TempSrc:   PainSc: Asleep                 [name]

## 2022-05-09 NOTE — Op Note (Signed)
Patient:  Kenneth Warren  DOB:  1976/01/07  MRN:  [id number]   Preop Diagnosis: Meckel's diverticulitis  Postop Diagnosis: Same  Procedure: Laparoscopy, Meckel's diverticulectomy  Surgeon: Franky Macho, MD  Anes: General endotracheal  Indications: Patient is a 46 year old white male who presents with a 3-day history of worsening lower abdominal pain.  CT scan of the abdomen reveals acute Meckel's diverticulitis.  The patient now presents for laparoscopic Meckel's diverticulectomy, possible open.  The risks and benefits of the procedure including bleeding, infection, and the possibility of an open procedure were fully explained to the patient, who gave informed consent.  Procedure note: The patient was placed in the supine position.  After induction of general endotracheal anesthesia, the abdomen was prepped and draped using the usual sterile technique with ChloraPrep.  Surgical site confirmation was performed.  A supraumbilical incision was made down to the fascia.  A Veress needle was introduced into the abdominal cavity and confirmation of placement was done using the saline drop test.  The abdomen was then insufflated to 15 mmHg pressure.  An 11 mm trocar was introduced into the abdominal cavity under direct visualization without difficulty.  The patient was placed in deeper Trendelenburg position and an additional 12 mm trocar was placed in the suprapubic region and a 5 mm trocar was placed in the left lower quadrant region.  The small bowel was run proximally from the terminal ileum.  A large widemouth inflamed Meckel's diverticulum was noted to be attached to the pelvis in close proximity to the bladder.  This was bluntly freed away without difficulty.  Given its widemouth and inflamed base, I felt it was better to proceed with an open partial small bowel resection.  A lower infraumbilical midline incision was made.  The peritoneal cavity was entered into without difficulty.  The  affected small bowel was exteriorized.  A GIA 75 stapler was placed proximally and distally around the Meckel's diverticulum.  The mesentery was divided using the LigaSure.  The specimen was then removed from the operative field and sent to pathology for further examination.  A side-to-side small bowel anastomosis was performed using a GIA 75 stapler.  The enterotomy was closed using a TA 60 stapler.  The staple line was bolstered using 3-0 silk Lembert sutures.  The mesenteric defect was closed using a 3-0 Chromic Gut interrupted suture.  The bowel was returned into the abdominal cavity in an orderly fashion.  The abdominal cavity was then copiously irrigated with normal saline.  All operating personnel then changed their gloves.  The midline fascia was closed using a looped 0 PDS running suture.  Exparel was instilled into all the wounds.  The supraumbilical fascia was reapproximated using 0 Vicryl interrupted suture.  The laparoscopic incision sites were closed using a 4-0 Monocryl subcuticular suture.  The midline incision was closed using staples.  Dermabond and Betadine were applied appropriately.  Dry sterile dressing was then applied.  All tape and needle counts were correct at the end of the procedure.  The patient was extubated in the operating room and transferred to PACU in stable condition.  Complications: None  EBL: Minimal  Specimen: Meckel's diverticulum

## 2022-05-09 NOTE — Interval H&P Note (Signed)
History and Physical Interval Note:  05/09/2022 1:04 PM  Kenneth Warren  has presented today for surgery, with the diagnosis of Meckel's diverticulectomy.  The various methods of treatment have been discussed with the patient and family. After consideration of risks, benefits and other options for treatment, the patient has consented to  Procedure(s): Laparoscopic Meckel's diverticulectomy (N/A) as a surgical intervention.  The patient's history has been reviewed, patient examined, no change in status, stable for surgery.  I have reviewed the patient's chart and labs.  Questions were answered to the patient's satisfaction.     Franky Macho

## 2022-05-09 NOTE — Anesthesia Procedure Notes (Signed)
Procedure Name: Intubation Date/Time: 05/09/2022 1:32 PM Performed by: Myna Bright, CRNA Pre-anesthesia Checklist: Patient identified, Emergency Drugs available, Suction available and Patient being monitored Patient Re-evaluated:Patient Re-evaluated prior to induction Oxygen Delivery Method: Circle system utilized Preoxygenation: Pre-oxygenation with 100% oxygen Induction Type: IV induction Ventilation: Mask ventilation without difficulty Laryngoscope Size: Mac and 4 Grade View: Grade II Tube type: Oral Tube size: 7.5 mm Number of attempts: 1 Airway Equipment and Method: Stylet Placement Confirmation: ETT inserted through vocal cords under direct vision, positive ETCO2 and breath sounds checked- equal and bilateral Secured at: 22 cm Tube secured with: Tape Dental Injury: Teeth and Oropharynx as per pre-operative assessment

## 2022-05-09 NOTE — Progress Notes (Signed)
Ekg done by nursing at 23;45, did not transmit in muse. A copy is in chart, Muse copy is lost.

## 2022-05-09 NOTE — Progress Notes (Signed)
Patient seen and discussed with Dr. Arnoldo Morale. Plan is for operative intervention today. Dr. Arnoldo Morale has graciously assumed care. No significant medical issues. TRH will sign off. Please call if we can be of assistance.  Murray Hodgkins, MD [facility]  No charge note

## 2022-05-10 LAB — BASIC METABOLIC PANEL
Anion gap: 9 (ref 5–15)
BUN: 12 mg/dL (ref 6–20)
CO2: 22 mmol/L (ref 22–32)
Calcium: 8.2 mg/dL — ABNORMAL LOW (ref 8.9–10.3)
Chloride: 105 mmol/L (ref 98–111)
Creatinine, Ser: 0.77 mg/dL (ref 0.61–1.24)
GFR, Estimated: >60 mL/min (ref >60)
Glucose, Bld: 115 mg/dL — ABNORMAL HIGH (ref 70–99)
Potassium: 4.3 mmol/L (ref 3.5–5.1)
Sodium: 136 mmol/L (ref 135–145)

## 2022-05-10 LAB — CBC
HCT: 45.8 % (ref 39.0–52.0)
Hemoglobin: 15.7 g/dL (ref 13.0–17.0)
MCH: 31.1 pg (ref 26.0–34.0)
MCHC: 34.3 g/dL (ref 30.0–36.0)
MCV: 90.7 fL (ref 80.0–100.0)
Platelets: 211 10*3/uL (ref 150–400)
RBC: 5.05 MIL/uL (ref 4.22–5.81)
RDW: 12.1 % (ref 11.5–15.5)
WBC: 13.5 10*3/uL — ABNORMAL HIGH (ref 4.0–10.5)
nRBC: 0 % (ref 0.0–0.2)

## 2022-05-11 LAB — BASIC METABOLIC PANEL
Anion gap: 6 (ref 5–15)
BUN: 11 mg/dL (ref 6–20)
CO2: 27 mmol/L (ref 22–32)
Calcium: 8.3 mg/dL — ABNORMAL LOW (ref 8.9–10.3)
Chloride: 107 mmol/L (ref 98–111)
Creatinine, Ser: 0.93 mg/dL (ref 0.61–1.24)
GFR, Estimated: >60 mL/min (ref >60)
Glucose, Bld: 85 mg/dL (ref 70–99)
Potassium: 3.9 mmol/L (ref 3.5–5.1)
Sodium: 140 mmol/L (ref 135–145)

## 2022-05-11 LAB — CBC
HCT: 44.8 % (ref 39.0–52.0)
Hemoglobin: 14.9 g/dL (ref 13.0–17.0)
MCH: 30.7 pg (ref 26.0–34.0)
MCHC: 33.3 g/dL (ref 30.0–36.0)
MCV: 92.4 fL (ref 80.0–100.0)
Platelets: 182 10*3/uL (ref 150–400)
RBC: 4.85 MIL/uL (ref 4.22–5.81)
RDW: 12.3 % (ref 11.5–15.5)
WBC: 8.7 10*3/uL (ref 4.0–10.5)
nRBC: 0 % (ref 0.0–0.2)

## 2022-05-11 MED ORDER — BISACODYL 10 MG RE SUPP
10.0000 mg | Freq: Once | RECTAL | Status: AC
  Administered 2022-05-11: 10 mg via RECTAL
  Filled 2022-05-11: qty 1

## 2022-05-11 MED ORDER — MAGNESIUM HYDROXIDE 400 MG/5ML PO SUSP
30.0000 mL | Freq: Once | ORAL | Status: AC
Start: 2022-05-11 — End: 2022-05-11
  Administered 2022-05-11: 30 mL via ORAL
  Filled 2022-05-11: qty 30

## 2022-05-11 MED ORDER — POLYETHYLENE GLYCOL 3350 17 G PO PACK
17.0000 g | PACK | Freq: Two times a day (BID) | ORAL | Status: DC
  Administered 2022-05-11 – 2022-05-13 (×5): 17 g via ORAL
  Filled 2022-05-11 (×5): qty 1

## 2022-05-11 NOTE — Progress Notes (Signed)
This nurse gave pt miralax at 0800 and suppository at 1400 to help with flatus and to help him have him a BM. So far pt hasnt been able to do anything. Pt states he is miserable. Oxy 10mg  given for pain. Notified MD.

## 2022-05-11 NOTE — Progress Notes (Signed)
2 Days Post-Op  Subjective: Patient having abdominal cramping with some bloating.  He has not had a bowel movement yet.  He has some right shoulder pain which was probably secondary to the laparoscopic part of the procedure.  Objective: Vital signs in last 24 hours: Temp:  [97.6 F (36.4 C)-98.4 F (36.9 C)] 97.6 F (36.4 C) (05/27 0402) Pulse Rate:  [51-63] 51 (05/27 0402) Resp:  [16-18] 16 (05/27 0402) BP: (103-118)/(66-76) 118/69 (05/27 0402) SpO2:  [99 %] 99 % (05/27 0402) Last BM Date :  (prior to admission)  Intake/Output from previous day: 05/26 0701 - 05/27 0700 In: 1981.8 [P.O.:720; I.V.:1061.8; IV Piggyback:200] Out: -  Intake/Output this shift: No intake/output data recorded.  General appearance: alert, cooperative, and no distress Resp: clear to auscultation bilaterally Cardio: regular rate and rhythm, S1, S2 normal, no murmur, click, rub or gallop GI: Soft, slightly distended.  Occasional bowel sounds appreciated.  Incisions healing well.  Lab Results:  Recent Labs    05/10/22 0451 05/11/22 0453  WBC 13.5* 8.7  HGB 15.7 14.9  HCT 45.8 44.8  PLT 211 182   BMET Recent Labs    05/10/22 0451 05/11/22 0453  NA 136 140  K 4.3 3.9  CL 105 107  CO2 22 27  GLUCOSE 115* 85  BUN 12 11  CREATININE 0.77 0.93  CALCIUM 8.2* 8.3*   PT/INR Recent Labs    05/09/22 0427  LABPROT 13.5  INR 1.0    Studies/Results: No results found.  Anti-infectives: Anti-infectives (From admission, onward)    Start     Dose/Rate Route Frequency Ordered Stop   05/09/22 2200  piperacillin-tazobactam (ZOSYN) IVPB 3.375 g        3.375 g 12.5 mL/hr over 240 Minutes Intravenous Every 8 hours 05/09/22 1555 05/14/22 2159   05/09/22 0600  piperacillin-tazobactam (ZOSYN) IVPB 3.375 g  Status:  Discontinued        3.375 g 12.5 mL/hr over 240 Minutes Intravenous Every 8 hours 05/08/22 2207 05/09/22 1555   05/09/22 0000  piperacillin-tazobactam (ZOSYN) IVPB 3.375 g  Status:   Discontinued        3.375 g 100 mL/hr over 30 Minutes Intravenous Every 6 hours 05/08/22 2201 05/08/22 2207   05/08/22 2215  cefTRIAXone (ROCEPHIN) 2 g in sodium chloride 0.9 % 100 mL IVPB  Status:  Discontinued        2 g 200 mL/hr over 30 Minutes Intravenous Every 24 hours 05/08/22 2201 05/08/22 2202   05/08/22 2215  metroNIDAZOLE (FLAGYL) IVPB 500 mg  Status:  Discontinued        500 mg 100 mL/hr over 60 Minutes Intravenous Every 12 hours 05/08/22 2201 05/08/22 2202   05/08/22 2200  piperacillin-tazobactam (ZOSYN) IVPB 3.375 g        3.375 g 100 mL/hr over 30 Minutes Intravenous  Once 05/08/22 2147 05/08/22 2250       Assessment/Plan: s/p Procedure(s): Meckel's diverticulectomy Open, attempted Laparoscopic Impression: Postoperative day 2, awaiting full return of bowel function.  Labs look good.  Have started MiraLAX.  Continue ambulation.  LOS: 3 days    Kenneth Warren 05/11/2022

## 2022-05-12 LAB — BASIC METABOLIC PANEL
Anion gap: 6 (ref 5–15)
BUN: 9 mg/dL (ref 6–20)
CO2: 29 mmol/L (ref 22–32)
Calcium: 8.7 mg/dL — ABNORMAL LOW (ref 8.9–10.3)
Chloride: 105 mmol/L (ref 98–111)
Creatinine, Ser: 0.91 mg/dL (ref 0.61–1.24)
GFR, Estimated: >60 mL/min (ref >60)
Glucose, Bld: 93 mg/dL (ref 70–99)
Potassium: 3.9 mmol/L (ref 3.5–5.1)
Sodium: 140 mmol/L (ref 135–145)

## 2022-05-12 LAB — CBC
HCT: 46.2 % (ref 39.0–52.0)
Hemoglobin: 15.6 g/dL (ref 13.0–17.0)
MCH: 30.8 pg (ref 26.0–34.0)
MCHC: 33.8 g/dL (ref 30.0–36.0)
MCV: 91.1 fL (ref 80.0–100.0)
Platelets: 200 10*3/uL (ref 150–400)
RBC: 5.07 MIL/uL (ref 4.22–5.81)
RDW: 12.3 % (ref 11.5–15.5)
WBC: 7.4 10*3/uL (ref 4.0–10.5)
nRBC: 0 % (ref 0.0–0.2)

## 2022-05-12 LAB — PHOSPHORUS: Phosphorus: 4.4 mg/dL (ref 2.5–4.6)

## 2022-05-12 LAB — MAGNESIUM: Magnesium: 2.2 mg/dL (ref 1.7–2.4)

## 2022-05-12 NOTE — Progress Notes (Signed)
3 Days Post-Op  Subjective: Patient feels better after having multiple bowel movements.  He is a little nauseated this morning with his full liquid diet as he states the grits do not smell good.  Continues to pass flatus.  Objective: Vital signs in last 24 hours: Temp:  [97.5 F (36.4 C)-98.3 F (36.8 C)] 98.3 F (36.8 C) (05/28 0504) Pulse Rate:  [52-69] 69 (05/28 0504) Resp:  [18-20] 18 (05/28 0504) BP: (115-129)/(80-85) 121/80 (05/28 0504) SpO2:  [97 %-100 %] 97 % (05/28 0504) Last BM Date :  (prior to admission)  Intake/Output from previous day: 05/27 0701 - 05/28 0700 In: 1816.5 [P.O.:720; I.V.:938.2; IV Piggyback:158.4] Out: -  Intake/Output this shift: No intake/output data recorded.  General appearance: alert, cooperative, and no distress Resp: clear to auscultation bilaterally Cardio: regular rate and rhythm, S1, S2 normal, no murmur, click, rub or gallop GI: Soft, bowel sounds appreciated.  Incision healing well.  Nondistended.  Lab Results:  Recent Labs    05/11/22 0453 05/12/22 0405  WBC 8.7 7.4  HGB 14.9 15.6  HCT 44.8 46.2  PLT 182 200   BMET Recent Labs    05/11/22 0453 05/12/22 0405  NA 140 140  K 3.9 3.9  CL 107 105  CO2 27 29  GLUCOSE 85 93  BUN 11 9  CREATININE 0.93 0.91  CALCIUM 8.3* 8.7*   PT/INR No results for input(s): LABPROT, INR in the last 72 hours.  Studies/Results: No results found.  Anti-infectives: Anti-infectives (From admission, onward)    Start     Dose/Rate Route Frequency Ordered Stop   05/09/22 2200  piperacillin-tazobactam (ZOSYN) IVPB 3.375 g        3.375 g 12.5 mL/hr over 240 Minutes Intravenous Every 8 hours 05/09/22 1555 05/14/22 2159   05/09/22 0600  piperacillin-tazobactam (ZOSYN) IVPB 3.375 g  Status:  Discontinued        3.375 g 12.5 mL/hr over 240 Minutes Intravenous Every 8 hours 05/08/22 2207 05/09/22 1555   05/09/22 0000  piperacillin-tazobactam (ZOSYN) IVPB 3.375 g  Status:  Discontinued         3.375 g 100 mL/hr over 30 Minutes Intravenous Every 6 hours 05/08/22 2201 05/08/22 2207   05/08/22 2215  cefTRIAXone (ROCEPHIN) 2 g in sodium chloride 0.9 % 100 mL IVPB  Status:  Discontinued        2 g 200 mL/hr over 30 Minutes Intravenous Every 24 hours 05/08/22 2201 05/08/22 2202   05/08/22 2215  metroNIDAZOLE (FLAGYL) IVPB 500 mg  Status:  Discontinued        500 mg 100 mL/hr over 60 Minutes Intravenous Every 12 hours 05/08/22 2201 05/08/22 2202   05/08/22 2200  piperacillin-tazobactam (ZOSYN) IVPB 3.375 g        3.375 g 100 mL/hr over 30 Minutes Intravenous  Once 05/08/22 2147 05/08/22 2250       Assessment/Plan: s/p Procedure(s): Meckel's diverticulectomy Open, attempted Laparoscopic Impression: Improved on postoperative day 3.  Bowel function starting return.  Will monitor nausea.  Advance to regular diet.  Anticipate discharge in next 24 to 48 hours.  LOS: 4 days    Kenneth Warren 05/12/2022

## 2022-05-13 NOTE — Progress Notes (Signed)
Nsg Discharge Note  Admit Date:  05/08/2022 Discharge date: 05/13/2022   Cathren Laine to be D/C'd Home per MD order.  AVS completed.  Copy for chart, and copy for patient signed, and dated. Patient/caregiver able to verbalize understanding.  Discharge Medication: Allergies as of 05/13/2022   No Known Allergies      Medication List     STOP taking these medications    Enstilar 0.005-0.064 % Foam Generic drug: Calcipotriene-Betameth Diprop       TAKE these medications    meloxicam 15 MG tablet Commonly known as: MOBIC Take 1 tablet by mouth once daily   triamcinolone cream 0.1 % Commonly known as: KENALOG Apply 1 application. topically 3 (three) times daily.        Discharge Assessment: Vitals:   05/12/22 2115 05/13/22 0534  BP: 105/77 114/84  Pulse: (!) 59 (!) 49  Resp: 20 20  Temp: 98.3 F (36.8 C) (!) 97.5 F (36.4 C)  SpO2: 100% 98%   Skin clean, dry and intact without evidence of skin break down, no evidence of skin tears noted. IV catheter discontinued intact. Site without signs and symptoms of complications - no redness or edema noted at insertion site, patient denies c/o pain - only slight tenderness at site.  Dressing with slight pressure applied.  D/c Instructions-Education: Discharge instructions given to patient/family with verbalized understanding. D/c education completed with patient/family including follow up instructions, medication list, d/c activities limitations if indicated, with other d/c instructions as indicated by MD - patient able to verbalize understanding, all questions fully answered. Patient instructed to return to ED, call 911, or call MD for any changes in condition.  Patient escorted via [place], and D/C home via private auto.  Dorcas Mcmurray, LPN [id number] AM

## 2022-05-13 NOTE — Discharge Summary (Signed)
Physician Discharge Summary  Patient ID: Kenneth Warren MRN: [id number] DOB/AGE: [birth year] 46 y.o.  Admit date: 05/08/2022 Discharge date: 05/13/2022  Admission Diagnoses: Meckel's diverticulitis  Discharge Diagnoses: Same Principal Problem:   Meckel's diverticulitis Active Problems:   Psoriasis   Sepsis due to undetermined organism [facility])   Discharged Condition: good  Hospital Course: Patient is a 46 year old white male who presented to the emergency room on 05/08/2022 with worsening lower abdominal pain.  CT scan of the abdomen revealed acute Meckel's diverticulitis.  The patient was taken to the operating room on 05/09/2022 and underwent a partial small bowel resection to take care of the Meckel's diverticulitis.  He tolerated the procedure well.  He had a mild postoperative ileus but that has since resolved.  His diet was advanced out difficulty.  The patient is being discharged home on 05/13/2022 in good and improving condition.  Final pathology is pending.  Treatments: surgery: Partial small bowel resection for Meckel's diverticulitis on 05/09/2022  Discharge Exam: Blood pressure 114/84, pulse (!) 49, temperature (!) 97.5 F (36.4 C), temperature source Oral, resp. rate 20, height 6' (1.829 m), weight 79.5 kg, SpO2 98 %. General appearance: alert, cooperative, and no distress Resp: clear to auscultation bilaterally Cardio: regular rate and rhythm, S1, S2 normal, no murmur, click, rub or gallop GI: Soft, incisions healing well.  Disposition: Home   Allergies as of 05/13/2022   No Known Allergies      Medication List     STOP taking these medications    Enstilar 0.005-0.064 % Foam Generic drug: Calcipotriene-Betameth Diprop       TAKE these medications    meloxicam 15 MG tablet Commonly known as: MOBIC Take 1 tablet by mouth once daily   triamcinolone cream 0.1 % Commonly known as: KENALOG Apply 1 application. topically 3 (three) times daily.         Follow-up Information     Franky Macho, MD. Schedule an appointment as soon as possible for a visit on 05/16/2022.   Specialty: General Surgery Contact information: [address] [place] [address] [phone]                 Signed: Franky Macho 05/13/2022, 8:36 AM

## 2022-05-14 ENCOUNTER — Encounter (HOSPITAL_COMMUNITY): Payer: Self-pay | Admitting: General Surgery

## 2022-05-14 LAB — SURGICAL PATHOLOGY

## 2022-05-14 NOTE — Patient Outreach (Signed)
Received a hospital discharge notification for Mr. Fullen . I have assigned Deloria Lair, NP to call for follow up and determine if there are any Case Management needs.    [facility], [place], [place] [facility] [phone]

## 2022-05-16 ENCOUNTER — Encounter: Payer: Self-pay | Admitting: General Surgery

## 2022-05-16 ENCOUNTER — Ambulatory Visit (INDEPENDENT_AMBULATORY_CARE_PROVIDER_SITE_OTHER): Payer: No Typology Code available for payment source | Admitting: General Surgery

## 2022-05-16 VITALS — BP 124/87 | HR 81 | Temp 98.2°F | Resp 14 | Ht 72.0 in | Wt 173.0 lb

## 2022-05-16 DIAGNOSIS — Z09 Encounter for follow-up examination after completed treatment for conditions other than malignant neoplasm: Secondary | ICD-10-CM

## 2022-05-16 NOTE — Progress Notes (Signed)
Subjective:     Kenneth Warren  Patient here for postoperative visit, status post Meckel's diverticulectomy.  Patient is doing well.  He has had minimal incisional pain.  His bowel movements have returned to normal. Objective:    BP 124/87   Pulse 81   Temp 98.2 F (36.8 C) (Oral)   Resp 14   Ht 6' (1.829 m)   Wt 173 lb (78.5 kg)   SpO2 99%   BMI 23.46 kg/m   General:  alert, cooperative, and no distress  Abdomen soft, incisions healing well.  Midline incision staples were removed, Steri-Strips applied. Final pathology reviewed with patient     Assessment:    Doing well postoperatively.    Plan:   May increase activity as able.  Follow-up here as needed.

## 2022-05-22 ENCOUNTER — Other Ambulatory Visit: Payer: Self-pay | Admitting: *Deleted

## 2022-05-24 ENCOUNTER — Other Ambulatory Visit: Payer: Self-pay | Admitting: *Deleted

## 2022-05-24 NOTE — Patient Outreach (Signed)
[facility]) Care Management  05/24/2022  TZION WEDEL Sep 21, 1976 [id number]  Talked with Mr. Higinbotham briefly as a transition of care call.  Pt had bowel resection due to Meckle's diverticulitis. This was first instance. He says he thought he was having a kidney stone, the pain was similar. He was treated and released. He has seen his surgeon for a follow up.  He has his meds.  He knows who to contact with problems.  Advised pt that Dr. Scharlene Gloss office has a care manager to provide chronic disease mgmt services. He is not in any need for them at present.  Patient Active Problem List   Diagnosis Date Noted   Meckel's diverticulitis 05/08/2022   Psoriasis 05/08/2022   Sepsis due to undetermined organism (HCC) 05/08/2022    PATIENT IS NOT ON ANY MEDICATIONS AT THIS TIME.  PATIENT DOES NOT HAVE ANY TRANSPORTATION OR FOOD NEEDS.  NO FURTHER CONTACT NEEDED.  Zara Council. Burgess Estelle, MSN, [facility] Gerontological Nurse Practitioner [facility] Care Management [phone]

## 2022-10-04 ENCOUNTER — Other Ambulatory Visit (HOSPITAL_COMMUNITY): Payer: Self-pay

## 2022-10-04 MED ORDER — OXYCODONE HCL 5 MG PO TABS
5.0000 mg | ORAL_TABLET | Freq: Three times a day (TID) | ORAL | 0 refills | Status: DC | PRN
  Filled 2022-10-04: qty 15, 5d supply, fill #0

## 2022-10-04 MED ORDER — TAMSULOSIN HCL 0.4 MG PO CAPS
0.4000 mg | ORAL_CAPSULE | Freq: Every day | ORAL | 0 refills | Status: AC
  Filled 2022-10-04: qty 30, 30d supply, fill #0

## 2023-04-23 ENCOUNTER — Encounter: Payer: Self-pay | Admitting: Gastroenterology

## 2023-05-27 ENCOUNTER — Encounter: Payer: Self-pay | Admitting: Gastroenterology

## 2023-05-27 ENCOUNTER — Other Ambulatory Visit (HOSPITAL_COMMUNITY): Payer: Self-pay

## 2023-05-27 ENCOUNTER — Ambulatory Visit (AMBULATORY_SURGERY_CENTER): Payer: 59 | Admitting: *Deleted

## 2023-05-27 VITALS — Ht 72.0 in | Wt 185.0 lb

## 2023-05-27 DIAGNOSIS — Z1211 Encounter for screening for malignant neoplasm of colon: Secondary | ICD-10-CM

## 2023-05-27 MED ORDER — NA SULFATE-K SULFATE-MG SULF 17.5-3.13-1.6 GM/177ML PO SOLN
1.0000 | Freq: Once | ORAL | 0 refills | Status: AC
  Filled 2023-05-27: qty 354, 2d supply, fill #0

## 2023-05-27 NOTE — Progress Notes (Signed)
Pt's name and DOB verified at the beginning of the pre-visit.  Pt denies any difficulty with ambulating,sitting, laying down or rolling side to side Gave both [facility] main # and MD on call # prior to instructions.  No egg or soy allergy known to patient  No issues known to pt with past sedation with any surgeries or procedures Pt denies having issues being intubated Pt has no issues moving head neck or swallowing No FH of Malignant Hyperthermia Pt is not on diet pills Pt is not on home 02  Pt is not on blood thinners  Pt denies issues with constipation  Pt is not on dialysis Pt denise any abnormal heart rhythms  Pt denies any upcoming cardiac testing Pt encouraged to use to use Singlecare or Goodrx to reduce cost  Patient's chart reviewed by Cathlyn Parsons CNRA prior to pre-visit and patient appropriate for the [facility].  Pre-visit completed and red dot placed by patient's name on their procedure day (on provider's schedule).  . Visit by phone Pt states weight is 185 lb Instructed pt why it is important to and  to call if they have any changes in health or new medications. Directed them to the # given and on instructions.   Pt states they will.  Instructions reviewed with pt and pt states understanding. Instructed to review again prior to procedure. Pt states they will.  Instructions sent by mail with coupon and by my chart

## 2023-05-28 DIAGNOSIS — Z139 Encounter for screening, unspecified: Secondary | ICD-10-CM | POA: Diagnosis not present

## 2023-05-28 DIAGNOSIS — E782 Mixed hyperlipidemia: Secondary | ICD-10-CM | POA: Diagnosis not present

## 2023-05-29 ENCOUNTER — Other Ambulatory Visit (HOSPITAL_COMMUNITY): Payer: Self-pay

## 2023-06-04 ENCOUNTER — Other Ambulatory Visit (HOSPITAL_COMMUNITY): Payer: Self-pay | Admitting: Internal Medicine

## 2023-06-04 DIAGNOSIS — E782 Mixed hyperlipidemia: Secondary | ICD-10-CM

## 2023-06-16 ENCOUNTER — Ambulatory Visit (AMBULATORY_SURGERY_CENTER): Payer: 59 | Admitting: Gastroenterology

## 2023-06-16 ENCOUNTER — Encounter: Payer: Self-pay | Admitting: Gastroenterology

## 2023-06-16 VITALS — BP 101/63 | HR 58 | Temp 96.8°F | Resp 12 | Ht 72.0 in | Wt 185.0 lb

## 2023-06-16 DIAGNOSIS — K640 First degree hemorrhoids: Secondary | ICD-10-CM

## 2023-06-16 DIAGNOSIS — K5730 Diverticulosis of large intestine without perforation or abscess without bleeding: Secondary | ICD-10-CM | POA: Diagnosis not present

## 2023-06-16 DIAGNOSIS — D125 Benign neoplasm of sigmoid colon: Secondary | ICD-10-CM

## 2023-06-16 DIAGNOSIS — Z1211 Encounter for screening for malignant neoplasm of colon: Secondary | ICD-10-CM

## 2023-06-16 DIAGNOSIS — D120 Benign neoplasm of cecum: Secondary | ICD-10-CM

## 2023-06-16 DIAGNOSIS — K635 Polyp of colon: Secondary | ICD-10-CM | POA: Diagnosis not present

## 2023-06-16 MED ORDER — SODIUM CHLORIDE 0.9 % IV SOLN: 500.0000 mL | Freq: Once | INTRAVENOUS | Status: DC

## 2023-06-16 NOTE — Progress Notes (Signed)
GASTROENTEROLOGY PROCEDURE H&P NOTE   Primary Care Physician: Benita Stabile, MD    Reason for Procedure:  Colon Cancer screening  Plan:    Colonoscopy  Patient is appropriate for endoscopic procedure(s) in the ambulatory ([facility]) setting.  The nature of the procedure, as well as the risks, benefits, and alternatives were carefully and thoroughly reviewed with the patient. Ample time for discussion and questions allowed. The patient understood, was satisfied, and agreed to proceed.     HPI: Kenneth Warren is a 47 y.o. male who presents for colonoscopy for routine Colon Cancer screening.  No active GI symptoms.  No known family history of colon cancer or related malignancy.  Patient is otherwise without complaints or active issues today.  Hx of Meckel's diverticulitis in 04/2022 requiring surgical resection with side-to-side reanastamosis.   Past Medical History:  Diagnosis Date   Kidney stones     Past Surgical History:  Procedure Laterality Date   LAPAROSCOPIC APPENDECTOMY N/A 05/09/2022   Procedure: Meckel's diverticulectomy Open, attempted Laparoscopic;  Surgeon: Franky Macho, MD;  Location: [facility];  Service: General;  Laterality: N/A;   SHOULDER SURGERY Right    URETERAL STENT PLACEMENT      Prior to Admission medications   Medication Sig Start Date End Date Taking? Authorizing Provider  tamsulosin (FLOMAX) 0.4 MG CAPS capsule Take 1 capsule (0.4 mg total) by mouth daily. Patient taking differently: Take 0.4 mg by mouth daily. As needed 10/04/22  Yes   Calcipotriene-Betameth Diprop (ENSTILAR) 0.005-0.064 % FOAM APPLY TO AFFECTED AREA EXTERNALLY ONCE DAILY FOR 14 DAYS WHEN FLARED.    [provider]  oxyCODONE (OXY IR/ROXICODONE) 5 MG immediate release tablet Take 1 tablet (5 mg total) by mouth every 8 (eight) hours as needed for severe pain Patient taking differently: Take 5 mg by mouth every 8 (eight) hours as needed. As needed for kidney stone 10/04/22        Current Outpatient Medications  Medication Sig Dispense Refill   tamsulosin (FLOMAX) 0.4 MG CAPS capsule Take 1 capsule (0.4 mg total) by mouth daily. (Patient taking differently: Take 0.4 mg by mouth daily. As needed) 30 capsule 0   Calcipotriene-Betameth Diprop (ENSTILAR) 0.005-0.064 % FOAM APPLY TO AFFECTED AREA EXTERNALLY ONCE DAILY FOR 14 DAYS WHEN FLARED.     oxyCODONE (OXY IR/ROXICODONE) 5 MG immediate release tablet Take 1 tablet (5 mg total) by mouth every 8 (eight) hours as needed for severe pain (Patient taking differently: Take 5 mg by mouth every 8 (eight) hours as needed. As needed for kidney stone) 15 tablet 0   Current Facility-Administered Medications  Medication Dose Route Frequency Provider Last Rate Last Admin   0.9 %  sodium chloride infusion  500 mL Intravenous Once Shaka Cardin V, DO        Allergies as of 06/16/2023   (No Known Allergies)    Family History  Problem Relation Age of Onset   Colon cancer Neg Hx    Colon polyps Neg Hx    Esophageal cancer Neg Hx    Stomach cancer Neg Hx    Rectal cancer Neg Hx     Social History   Socioeconomic History   Marital status: Married    Spouse name: Not on file   Number of children: Not on file   Years of education: Not on file   Highest education level: Not on file  Occupational History   Not on file  Tobacco Use   Smoking status: Every Day  Packs/day: 1.5    Types: Cigarettes   Smokeless tobacco: Not on file  Vaping Use   Vaping Use: Never used  Substance and Sexual Activity   Alcohol use: Yes   Drug use: No   Sexual activity: Not on file  Other Topics Concern   Not on file  Social History Narrative   Not on file   Social Determinants of Health   Financial Resource Strain: Not on file  Food Insecurity: No Food Insecurity (05/24/2022)   Hunger Vital Sign    Worried About Running Out of Food in the Last Year: Never true    Ran Out of Food in the Last Year: Never true  Transportation  Needs: No Transportation Needs (05/24/2022)   PRAPARE - Administrator, Civil Service (Medical): No    Lack of Transportation (Non-Medical): No  Physical Activity: Not on file  Stress: Not on file  Social Connections: Not on file  Intimate Partner Violence: Not on file    Physical Exam: Vital signs in last 24 hours: @BP  102/66   Pulse (!) 55   Temp (!) 96.8 F (36 C) (Temporal)   Ht 6' (1.829 m)   Wt 185 lb (83.9 kg)   SpO2 99%   BMI 25.09 kg/m  GEN: NAD EYE: Sclerae anicteric ENT: MMM CV: Non-tachycardic Pulm: CTA b/l GI: Soft, NT/ND NEURO:  Alert & Oriented x 3   Doristine Locks, DO [facility]   06/16/2023 11:12 AM

## 2023-06-16 NOTE — Progress Notes (Signed)
Called to room to assist during endoscopic procedure.  Patient ID and intended procedure confirmed with present staff. Received instructions for my participation in the procedure from the performing physician.  

## 2023-06-16 NOTE — Progress Notes (Signed)
VS completed by EC.   Pt's states no medical or surgical changes since previsit or office visit.  

## 2023-06-16 NOTE — Patient Instructions (Signed)
Please read handouts provided. Continue present medications. Await pathology results. Return to GI office as needed.   YOU HAD AN ENDOSCOPIC PROCEDURE TODAY AT THE [facility]:   Refer to the procedure report that was given to you for any specific questions about what was found during the examination.  If the procedure report does not answer your questions, please call your gastroenterologist to clarify.  If you requested that your care partner not be given the details of your procedure findings, then the procedure report has been included in a sealed envelope for you to review at your convenience later.  YOU SHOULD EXPECT: Some feelings of bloating in the abdomen. Passage of more gas than usual.  Walking can help get rid of the air that was put into your GI tract during the procedure and reduce the bloating. If you had a lower endoscopy (such as a colonoscopy or flexible sigmoidoscopy) you may notice spotting of blood in your stool or on the toilet paper. If you underwent a bowel prep for your procedure, you may not have a normal bowel movement for a few days.  Please Note:  You might notice some irritation and congestion in your nose or some drainage.  This is from the oxygen used during your procedure.  There is no need for concern and it should clear up in a day or so.  SYMPTOMS TO REPORT IMMEDIATELY:  Following lower endoscopy (colonoscopy or flexible sigmoidoscopy):  Excessive amounts of blood in the stool  Significant tenderness or worsening of abdominal pains  Swelling of the abdomen that is new, acute  Fever of 100F or higher   For urgent or emergent issues, a gastroenterologist can be reached at any hour by calling [phone]. Do not use MyChart messaging for urgent concerns.    DIET:  We do recommend a small meal at first, but then you may proceed to your regular diet.  Drink plenty of fluids but you should avoid alcoholic beverages for 24 hours.  ACTIVITY:   You should plan to take it easy for the rest of today and you should NOT DRIVE or use heavy machinery until tomorrow (because of the sedation medicines used during the test).    FOLLOW UP: Our staff will call the number listed on your records the next business day following your procedure.  We will call around 7:15- 8:00 am to check on you and address any questions or concerns that you may have regarding the information given to you following your procedure. If we do not reach you, we will leave a message.     If any biopsies were taken you will be contacted by phone or by letter within the next 1-3 weeks.  Please call us at [phone] if you have not heard about the biopsies in 3 weeks.    SIGNATURES/CONFIDENTIALITY: You and/or your care partner have signed paperwork which will be entered into your electronic medical record.  These signatures attest to the fact that that the information above on your After Visit Summary has been reviewed and is understood.  Full responsibility of the confidentiality of this discharge information lies with you and/or your care-partner. 

## 2023-06-16 NOTE — Op Note (Signed)
[facility] Patient Name: Kenneth Warren Procedure Date: 06/16/2023 11:16 AM MRN: [id number] Endoscopist: Doristine Locks , MD, [id number] Age: 47 Referring MD:  Date of Birth: 1976/07/27 Gender: Male Account #: [id number] Procedure:                Colonoscopy Indications:              Screening for colorectal malignant neoplasm. This                            is the patient's first colonoscopy.                           History of Meckel's diverticulitis in 04/2022                            requiring surgical resection with side-to-side                            reanastamosis. Otherwise, no recent GI symptoms. Medicines:                Monitored Anesthesia Care Procedure:                Pre-Anesthesia Assessment:                           - Prior to the procedure, a History and Physical                            was performed, and patient medications and                            allergies were reviewed. The patient's tolerance of                            previous anesthesia was also reviewed. The risks                            and benefits of the procedure and the sedation                            options and risks were discussed with the patient.                            All questions were answered, and informed consent                            was obtained. Prior Anticoagulants: The patient has                            taken no anticoagulant or antiplatelet agents. ASA                            Grade Assessment: II - A patient with mild systemic  disease. After reviewing the risks and benefits,                            the patient was deemed in satisfactory condition to                            undergo the procedure.                           After obtaining informed consent, the colonoscope                            was passed under direct vision. Throughout the                            procedure, the patient's blood  pressure, pulse, and                            oxygen saturations were monitored continuously. The                            Olympus Scope SN: [id number] was introduced through                            the anus and advanced to the the terminal ileum.                            The colonoscopy was performed without difficulty.                            The patient tolerated the procedure well. The                            quality of the bowel preparation was good. The                            terminal ileum, ileocecal valve, appendiceal                            orifice, and rectum were photographed. Scope In: 11:21:03 AM Scope Out: 11:36:41 AM Scope Withdrawal Time: 0 hours 12 minutes 43 seconds  Total Procedure Duration: 0 hours 15 minutes 38 seconds  Findings:                 The perianal and digital rectal examinations were                            normal.                           A 3 mm polyp was found in the cecum. The polyp was                            sessile. The polyp was removed with a cold snare.  Resection and retrieval were complete. Estimated                            blood loss was minimal.                           Two sessile polyps were found in the sigmoid colon.                            The polyps were 3 to 4 mm in size. These polyps                            were removed with a cold snare. Resection and                            retrieval were complete. Estimated blood loss was                            minimal.                           A few medium-mouthed and small-mouthed diverticula                            were found in the sigmoid colon.                           Non-bleeding internal hemorrhoids were found during                            retroflexion. The hemorrhoids were small.                           The terminal ileum appeared normal. Complications:            No immediate complications. Estimated Blood  Loss:     Estimated blood loss was minimal. Impression:               - One 3 mm polyp in the cecum, removed with a cold                            snare. Resected and retrieved.                           - Two 3 to 4 mm polyps in the sigmoid colon,                            removed with a cold snare. Resected and retrieved.                           - Diverticulosis in the sigmoid colon.                           - Non-bleeding internal hemorrhoids.                           -  The examined portion of the ileum was normal. Recommendation:           - Patient has a contact number available for                            emergencies. The signs and symptoms of potential                            delayed complications were discussed with the                            patient. Return to normal activities tomorrow.                            Written discharge instructions were provided to the                            patient.                           - Resume previous diet.                           - Continue present medications.                           - Await pathology results.                           - Repeat colonoscopy for surveillance based on                            pathology results.                           - Return to GI office PRN. Doristine Locks, MD 06/16/2023 11:41:44 AM

## 2023-06-16 NOTE — Progress Notes (Signed)
Vss nad trans to pacu 

## 2023-06-17 ENCOUNTER — Telehealth: Payer: Self-pay

## 2023-06-17 NOTE — Telephone Encounter (Signed)
  Follow up Call-     06/16/2023   10:46 AM  Call back number  Post procedure Call Back phone  # [phone]  Permission to leave phone message Yes     Patient questions:  Do you have a fever, pain , or abdominal swelling? No. Pain Score  0 *  Have you tolerated food without any problems? Yes.    Have you been able to return to your normal activities? Yes.    Do you have any questions about your discharge instructions: Diet   No. Medications  No. Follow up visit  No.  Do you have questions or concerns about your Care? No.  Actions: * If pain score is 4 or above: No action needed, pain <4.

## 2023-08-04 DIAGNOSIS — L814 Other melanin hyperpigmentation: Secondary | ICD-10-CM | POA: Diagnosis not present

## 2023-08-04 DIAGNOSIS — L821 Other seborrheic keratosis: Secondary | ICD-10-CM | POA: Diagnosis not present

## 2023-08-04 DIAGNOSIS — D229 Melanocytic nevi, unspecified: Secondary | ICD-10-CM | POA: Diagnosis not present

## 2023-08-04 DIAGNOSIS — D1801 Hemangioma of skin and subcutaneous tissue: Secondary | ICD-10-CM | POA: Diagnosis not present

## 2023-08-04 DIAGNOSIS — Z86018 Personal history of other benign neoplasm: Secondary | ICD-10-CM | POA: Diagnosis not present

## 2023-08-04 DIAGNOSIS — L578 Other skin changes due to chronic exposure to nonionizing radiation: Secondary | ICD-10-CM | POA: Diagnosis not present

## 2023-08-04 DIAGNOSIS — D485 Neoplasm of uncertain behavior of skin: Secondary | ICD-10-CM | POA: Diagnosis not present

## 2023-08-05 ENCOUNTER — Ambulatory Visit (HOSPITAL_COMMUNITY)
Admission: RE | Admit: 2023-08-05 | Discharge: 2023-08-05 | Disposition: A | Discharge status: Home / Self Care | Payer: 59 | Source: Ambulatory Visit | Attending: Internal Medicine | Admitting: Internal Medicine

## 2023-08-05 DIAGNOSIS — E782 Mixed hyperlipidemia: Secondary | ICD-10-CM | POA: Insufficient documentation

## 2023-09-22 ENCOUNTER — Ambulatory Visit: Payer: 59 | Attending: Cardiovascular Disease | Admitting: Cardiovascular Disease

## 2023-09-22 ENCOUNTER — Other Ambulatory Visit (HOSPITAL_COMMUNITY): Payer: Self-pay

## 2023-09-22 ENCOUNTER — Other Ambulatory Visit: Payer: Self-pay

## 2023-09-22 ENCOUNTER — Encounter: Payer: Self-pay | Admitting: Cardiovascular Disease

## 2023-09-22 VITALS — BP 120/72 | HR 69 | Ht 72.0 in | Wt 187.6 lb

## 2023-09-22 DIAGNOSIS — R9389 Abnormal findings on diagnostic imaging of other specified body structures: Secondary | ICD-10-CM | POA: Diagnosis not present

## 2023-09-22 DIAGNOSIS — I25118 Atherosclerotic heart disease of native coronary artery with other forms of angina pectoris: Secondary | ICD-10-CM

## 2023-09-22 DIAGNOSIS — R072 Precordial pain: Secondary | ICD-10-CM | POA: Diagnosis not present

## 2023-09-22 MED ORDER — ASPIRIN 81 MG PO TBEC: 81.0000 mg | DELAYED_RELEASE_TABLET | Freq: Every day | ORAL | Status: AC

## 2023-09-22 MED ORDER — ROSUVASTATIN CALCIUM 10 MG PO TABS
10.0000 mg | ORAL_TABLET | Freq: Every day | ORAL | 3 refills | Status: DC
  Filled 2023-09-22: qty 90, 90d supply, fill #0
  Filled 2023-12-22: qty 90, 90d supply, fill #1

## 2023-09-22 MED ORDER — METOPROLOL TARTRATE 50 MG PO TABS
50.0000 mg | ORAL_TABLET | Freq: Once | ORAL | 0 refills | Status: AC
  Filled 2023-09-22: qty 1, 1d supply, fill #0

## 2023-09-22 NOTE — Progress Notes (Signed)
Chief Complaint  Patient presents with   New Patient (Initial Visit)    Abnormal coronary calcium score   History of Present Illness: 47 yo male with history of kidney stones and tobacco abuse who is here today as a new consult, referred by Dr. Margo Aye, for the evaluation of abnormal coronary calcium score. CT coronary calcium score of 223. LDL 102 in June 2024. He tells me today that he feels well. He has an ache in his chest that occurs at night. He does not notice this with exertion. He has no dyspnea, palpitations, dizziness, near syncope, syncope or lower extremity edema. He has smoked 1.5 packs per day but stopped a week ago.     Primary Care Physician: Benita Stabile, MD   Past Medical History:  Diagnosis Date   Kidney stones     Past Surgical History:  Procedure Laterality Date   LAPAROSCOPIC APPENDECTOMY N/A 05/09/2022   Procedure: Meckel's diverticulectomy Open, attempted Laparoscopic;  Surgeon: Franky Macho, MD;  Location: [facility];  Service: General;  Laterality: N/A;   SHOULDER SURGERY Right    URETERAL STENT PLACEMENT      Current Outpatient Medications  Medication Sig Dispense Refill   aspirin EC 81 MG tablet Take 1 tablet (81 mg total) by mouth daily. Swallow whole.     metoprolol tartrate (LOPRESSOR) 50 MG tablet Take 1 tablet (50 mg total) by mouth once for 1 dose. Take 90-120 minutes prior to scan. Hold for SBP less than 110. 1 tablet 0   rosuvastatin (CRESTOR) 10 MG tablet Take 1 tablet (10 mg total) by mouth daily. 90 tablet 3   oxyCODONE (OXY IR/ROXICODONE) 5 MG immediate release tablet Take 1 tablet (5 mg total) by mouth every 8 (eight) hours as needed for severe pain (Patient not taking: Reported on 09/22/2023) 15 tablet 0   tamsulosin (FLOMAX) 0.4 MG CAPS capsule Take 1 capsule (0.4 mg total) by mouth daily. (Patient not taking: Reported on 09/22/2023) 30 capsule 0   No current facility-administered medications for this visit.    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  Tobacco Use   Smoking status: Every Day    Current packs/day: 1.50    Types: Cigarettes   Smokeless tobacco: Not on file  Vaping Use   Vaping status: Never Used  Substance and Sexual Activity   Alcohol use: Yes   Drug use: No   Sexual activity: Not on file  Other Topics Concern   Not on file  Social History Narrative   Not on file   Social Determinants of Health   Financial Resource Strain: Not on file  Food Insecurity: No Food Insecurity (05/24/2022)   Hunger Vital Sign    Worried About Running Out of Food in the Last Year: Never true    Ran Out of Food in the Last Year: Never true  Transportation Needs: No Transportation Needs (05/24/2022)   PRAPARE - Administrator, Civil Service (Medical): No    Lack of Transportation (Non-Medical): No  Physical Activity: Not on file  Stress: Not on file  Social Connections: Not on file  Intimate Partner Violence: Not on file    Family History  Problem  Relation Age of Onset   CAD Mother    Heart attack Maternal Grandmother    Heart attack Maternal Grandfather    Colon cancer Neg Hx    Colon polyps Neg Hx    Esophageal cancer Neg Hx    Stomach cancer Neg Hx    Rectal cancer Neg Hx     Review of Systems:  As stated in the HPI and otherwise negative.   BP 120/72   Pulse 69   Ht 6' (1.829 m)   Wt 85.1 kg   SpO2 99%   BMI 25.44 kg/m   Physical Examination: General: Well developed, well nourished, NAD  HEENT: OP clear, mucus membranes moist  SKIN: warm, dry. No rashes. Neuro: No focal deficits  Musculoskeletal: Muscle strength 5/5 all ext  Psychiatric: Mood and affect normal  Neck: No JVD, no carotid bruits, no thyromegaly, no lymphadenopathy.  Lungs:Clear  bilaterally, no wheezes, rhonci, crackles Cardiovascular: Regular rate and rhythm. No murmurs, gallops or rubs. Abdomen:Soft. Bowel sounds present. Non-tender.  Extremities: No lower extremity edema. Pulses are 2 + in the bilateral DP/PT.  EKG:  EKG is ordered today. The ekg ordered today demonstrates  EKG Interpretation Date/Time:  Monday September 22 2023 13:56:54 EDT Ventricular Rate:  69 PR Interval:  130 QRS Duration:  106 QT Interval:  394 QTC Calculation: 422 R Axis:   24  Text Interpretation: Normal sinus rhythm Normal ECG Confirmed by Verne Carrow [phone]) on 09/22/2023 2:12:04 PM    Recent Labs: No results found for requested labs within last 365 days.   Lipid Panel No results found for: "CHOL", "TRIG", "HDL", "CHOLHDL", "VLDL", "LDLCALC", "LDLDIRECT"   Wt Readings from Last 3 Encounters:  09/22/23 85.1 kg  06/16/23 83.9 kg  05/27/23 83.9 kg    Assessment and Plan:   1. CAD with angina: He has chest pain at rest. Evidence of coronary artery calcification on chest CT. Strong FH of CAD and personal history of tobacco abuse. Will arrange coronary CTA to exclude obstructive CAD. Will start ASA 81 mg daily and Crestor 10 mg daily.   Labs/ tests ordered today include:   Orders Placed This Encounter  Procedures   CT CORONARY MORPH W/CTA COR W/SCORE W/CA W/CM &/OR WO/CM   Lipid panel   Hepatic function panel   EKG 12-Lead   Disposition:   F/U with me in 12  months.     Signed, Verne Carrow, MD, [facility] 09/22/2023 2:56 PM    [facility] [address] [place], [place], [place]  [address] Phone: [phone]; Fax: [phone]

## 2023-09-22 NOTE — Patient Instructions (Signed)
Medication Instructions:  Your physician has recommended you make the following change in your medication:  1.) start aspirin 81 mg - one tablet daily 2.) start rosuvastatin (Crestor) 10 mg - one tablet daily  *If you need a refill on your cardiac medications before your next appointment, please call your pharmacy*   Lab Work: Your physician recommends that you return for lab work in: 12 week (lipids, liver function)   Testing/Procedures: Cardiac CT Angiogram - see instructions below   Follow-Up: At [facility], you and your health needs are our priority.  As part of our continuing mission to provide you with exceptional heart care, we have created designated Provider Care Teams.  These Care Teams include your primary Cardiologist (physician) and Advanced Practice Providers (APPs -  Physician Assistants and Nurse Practitioners) who all work together to provide you with the care you need, when you need it.   Your next appointment:   12 month(s)  Provider:   Verne Carrow, MD     Other Instructions   Your cardiac CT will be scheduled at  [facility] [address] [place], [place] [address] [phone]  Please arrive at the [facility] and Children's Entrance (Entrance C2) of [facility] 30 minutes prior to test start time. You can use the FREE valet parking offered at entrance C (encouraged to control the heart rate for the test)  Proceed to the [facility] Radiology Department (first floor) to check-in and test prep.  All radiology patients and guests should use entrance C2 at [facility], accessed from [facility], even though the hospital's physical address listed is [address]     Please follow these instructions carefully (unless otherwise directed):  An IV will be required for this test and Nitroglycerin will be given.  Hold all erectile dysfunction medications at least 3 days (72 hrs) prior to  test. (Ie viagra, cialis, sildenafil, tadalafil, etc)   On the Night Before the Test: Be sure to Drink plenty of water. Do not consume any caffeinated/decaffeinated beverages or chocolate 12 hours prior to your test. Do not take any antihistamines 12 hours prior to your test.  On the Day of the Test: Drink plenty of water until 1 hour prior to the test. Do not eat any food 1 hour prior to test. You may take your regular medications prior to the test.  Take metoprolol (Lopressor) two hours prior to test.      After the Test: Drink plenty of water. After receiving IV contrast, you may experience a mild flushed feeling. This is normal. On occasion, you may experience a mild rash up to 24 hours after the test. This is not dangerous. If this occurs, you can take Benadryl 25 mg and increase your fluid intake. If you experience trouble breathing, this can be serious. If it is severe call 911 IMMEDIATELY. If it is mild, please call our office. If you take any of these medications: Glipizide/Metformin, Avandament, Glucavance, please do not take 48 hours after completing test unless otherwise instructed.  We will call to schedule your test 2-4 weeks out understanding that some insurance companies will need an authorization prior to the service being performed.   For more information and frequently asked questions, please visit our website : [email or web address]  For non-scheduling related questions, please contact the cardiac imaging nurse navigator should you have any questions/concerns: Cardiac Imaging Nurse Navigators Direct Office Dial: [phone]   For scheduling needs, including cancellations  and rescheduling, please call [place], [phone].

## 2023-09-23 ENCOUNTER — Other Ambulatory Visit (HOSPITAL_COMMUNITY): Payer: Self-pay

## 2023-09-29 ENCOUNTER — Encounter (HOSPITAL_COMMUNITY): Payer: Self-pay

## 2023-09-29 DIAGNOSIS — D239 Other benign neoplasm of skin, unspecified: Secondary | ICD-10-CM | POA: Diagnosis not present

## 2023-10-01 ENCOUNTER — Ambulatory Visit (HOSPITAL_BASED_OUTPATIENT_CLINIC_OR_DEPARTMENT_OTHER)
Admission: RE | Admit: 2023-10-01 | Discharge: 2023-10-01 | Disposition: A | Discharge status: Home / Self Care | Payer: 59 | Source: Ambulatory Visit | Attending: Internal Medicine | Admitting: Internal Medicine

## 2023-10-01 ENCOUNTER — Ambulatory Visit (HOSPITAL_COMMUNITY)
Admission: RE | Admit: 2023-10-01 | Discharge: 2023-10-01 | Disposition: A | Discharge status: Home / Self Care | Payer: 59 | Source: Ambulatory Visit | Attending: Cardiovascular Disease | Admitting: Cardiovascular Disease

## 2023-10-01 ENCOUNTER — Other Ambulatory Visit: Payer: Self-pay | Admitting: Internal Medicine

## 2023-10-01 DIAGNOSIS — R931 Abnormal findings on diagnostic imaging of heart and coronary circulation: Secondary | ICD-10-CM

## 2023-10-01 DIAGNOSIS — R072 Precordial pain: Secondary | ICD-10-CM

## 2023-10-01 MED ORDER — NITROGLYCERIN 0.4 MG SL SUBL
0.8000 mg | SUBLINGUAL_TABLET | Freq: Once | SUBLINGUAL | Status: AC
  Administered 2023-10-01: 0.8 mg via SUBLINGUAL

## 2023-10-01 MED ORDER — IOHEXOL 350 MG/ML SOLN
95.0000 mL | Freq: Once | INTRAVENOUS | Status: AC | PRN
  Administered 2023-10-01: 95 mL via INTRAVENOUS

## 2023-10-01 MED ORDER — NITROGLYCERIN 0.4 MG SL SUBL
SUBLINGUAL_TABLET | SUBLINGUAL | Status: AC
  Filled 2023-10-01: qty 2

## 2023-10-01 NOTE — Progress Notes (Signed)
Patient presents for a Cardiac CTtand tolerated procedure without incident.  Patient ambulated out of department with a steady gait.

## 2023-10-01 NOTE — Progress Notes (Signed)
CT sent for FFR

## 2023-10-17 DIAGNOSIS — N200 Calculus of kidney: Secondary | ICD-10-CM | POA: Diagnosis not present

## 2023-12-19 ENCOUNTER — Other Ambulatory Visit: Payer: Self-pay

## 2023-12-19 ENCOUNTER — Other Ambulatory Visit: Payer: 59

## 2023-12-19 DIAGNOSIS — I25118 Atherosclerotic heart disease of native coronary artery with other forms of angina pectoris: Secondary | ICD-10-CM

## 2023-12-19 LAB — LIPID PANEL
Chol/HDL Ratio: 2.7 {ratio} (ref 0.0–5.0)
Cholesterol, Total: 147 mg/dL (ref 100–199)
HDL: 55 mg/dL (ref >39)
LDL Chol Calc (NIH): 76 mg/dL (ref 0–99)
Triglycerides: 87 mg/dL (ref 0–149)
VLDL Cholesterol Cal: 16 mg/dL (ref 5–40)

## 2023-12-19 LAB — HEPATIC FUNCTION PANEL
ALT: 25 [IU]/L (ref 0–44)
AST: 23 [IU]/L (ref 0–40)
Albumin: 4.3 g/dL (ref 4.1–5.1)
Alkaline Phosphatase: 113 [IU]/L (ref 44–121)
Bilirubin Total: 0.6 mg/dL (ref 0.0–1.2)
Bilirubin, Direct: 0.22 mg/dL (ref 0.00–0.40)
Total Protein: 6.3 g/dL (ref 6.0–8.5)

## 2023-12-22 ENCOUNTER — Other Ambulatory Visit (HOSPITAL_BASED_OUTPATIENT_CLINIC_OR_DEPARTMENT_OTHER): Payer: Self-pay

## 2023-12-22 ENCOUNTER — Other Ambulatory Visit (HOSPITAL_COMMUNITY): Payer: Self-pay

## 2023-12-22 DIAGNOSIS — M4692 Unspecified inflammatory spondylopathy, cervical region: Secondary | ICD-10-CM | POA: Diagnosis not present

## 2023-12-22 DIAGNOSIS — M19011 Primary osteoarthritis, right shoulder: Secondary | ICD-10-CM | POA: Diagnosis not present

## 2023-12-22 MED ORDER — PREDNISONE 10 MG (21) PO TBPK
ORAL_TABLET | ORAL | 0 refills | Status: DC
  Filled 2023-12-22: qty 21, 6d supply, fill #0

## 2023-12-22 MED ORDER — METHOCARBAMOL 500 MG PO TABS
500.0000 mg | ORAL_TABLET | Freq: Three times a day (TID) | ORAL | 0 refills | Status: DC | PRN
  Filled 2023-12-22: qty 30, 10d supply, fill #0

## 2023-12-23 ENCOUNTER — Telehealth: Payer: Self-pay | Admitting: *Deleted

## 2023-12-23 ENCOUNTER — Other Ambulatory Visit (HOSPITAL_COMMUNITY): Payer: Self-pay

## 2023-12-23 DIAGNOSIS — I25118 Atherosclerotic heart disease of native coronary artery with other forms of angina pectoris: Secondary | ICD-10-CM

## 2023-12-23 MED ORDER — ROSUVASTATIN CALCIUM 20 MG PO TABS
20.0000 mg | ORAL_TABLET | Freq: Every day | ORAL | 3 refills | Status: AC
  Filled 2023-12-23 – 2024-09-22 (×4): qty 90, 90d supply, fill #0

## 2023-12-23 NOTE — Telephone Encounter (Signed)
 Pt in agreement to increase Crestor to 20 mg daily.  Updated script sent to [facility] outpatient pharmacy.  Sent [facility] req for 12 week labs in mail to patient.

## 2023-12-23 NOTE — Telephone Encounter (Signed)
-----   Message from Verne Carrow sent at 12/22/2023  2:27 PM EST ----- LDL above goal of 70. I would like to get his LDL down below 70 if possible. Can we see if he would be willing to increase his Crestor to 20 mg daily? LFTs normal. Thayer Ohm

## 2023-12-30 DIAGNOSIS — M5412 Radiculopathy, cervical region: Secondary | ICD-10-CM | POA: Diagnosis not present

## 2023-12-31 ENCOUNTER — Other Ambulatory Visit (HOSPITAL_COMMUNITY): Payer: Self-pay

## 2024-01-01 DIAGNOSIS — M542 Cervicalgia: Secondary | ICD-10-CM | POA: Diagnosis not present

## 2024-01-06 DIAGNOSIS — M5412 Radiculopathy, cervical region: Secondary | ICD-10-CM | POA: Diagnosis not present

## 2024-01-07 ENCOUNTER — Other Ambulatory Visit: Payer: Self-pay | Admitting: Orthopedic Surgery

## 2024-01-07 ENCOUNTER — Telehealth: Payer: Self-pay | Admitting: *Deleted

## 2024-01-07 NOTE — Telephone Encounter (Signed)
   [facility] Pre-operative Risk Assessment    Patient Name: Kenneth Warren  DOB: 11-05-1976 MRN: [id number]  HEARTCARE STAFF:  - IMPORTANT!!!!!! Under Visit Info/Reason for Call, type in Other and utilize the format Clearance MM/DD/YY or Clearance TBD. Do not use dashes or single digits. - Please review there is not already an duplicate clearance open for this procedure. - If request is for dental extraction, please clarify the # of teeth to be extracted. - If the patient is currently at the dentist's office, call Pre-Op Callback Staff (MA/nurse) to input urgent request.  - If the patient is not currently in the dentist office, please route to the Pre-Op pool.  Request for surgical clearance:  What type of surgery is being performed? Anterior cervical decompression fusion cervical 5-6 cervical 6-7 with instrumentation and allograft   When is this surgery scheduled? 01/21/24  What type of clearance is required (medical clearance vs. Pharmacy clearance to hold med vs. Both)? Both  Are there any medications that need to be held prior to surgery and how long? Pt on Asa [address]  Practice name and name of physician performing surgery? [facility], Dr Yevette Edwards  What is the office phone number? [phone]   7.   What is the office fax number? [phone] Tobey Bride  8.   Anesthesia type (None, local, MAC, general) ? Not indicated   Valrie Hart 01/07/2024, 3:50 PM  _________________________________________________________________   (provider comments below)

## 2024-01-07 NOTE — Telephone Encounter (Signed)
Pt has been scheduled tele preop appt 01/13/24 ok per Robin Searing , NP to add on due to procedure date and med hold.     Patient Consent for Virtual Visit        Kenneth Warren has provided verbal consent on 01/07/2024 for a virtual visit (video or telephone).   CONSENT FOR VIRTUAL VISIT FOR:  Kenneth Warren  By participating in this virtual visit I agree to the following:  I hereby voluntarily request, consent and authorize [facility] and its employed or contracted physicians, physician assistants, nurse practitioners or other licensed health care professionals (the Practitioner), to provide me with telemedicine health care services (the "Services") as deemed necessary by the treating Practitioner. I acknowledge and consent to receive the Services by the Practitioner via telemedicine. I understand that the telemedicine visit will involve communicating with the Practitioner through live audiovisual communication technology and the disclosure of certain medical information by electronic transmission. I acknowledge that I have been given the opportunity to request an in-person assessment or other available alternative prior to the telemedicine visit and am voluntarily participating in the telemedicine visit.  I understand that I have the right to withhold or withdraw my consent to the use of telemedicine in the course of my care at any time, without affecting my right to future care or treatment, and that the Practitioner or I may terminate the telemedicine visit at any time. I understand that I have the right to inspect all information obtained and/or recorded in the course of the telemedicine visit and may receive copies of available information for a reasonable fee.  I understand that some of the potential risks of receiving the Services via telemedicine include:  Delay or interruption in medical evaluation due to technological equipment failure or disruption; Information  transmitted may not be sufficient (e.g. poor resolution of images) to allow for appropriate medical decision making by the Practitioner; and/or  In rare instances, security protocols could fail, causing a breach of personal health information.  Furthermore, I acknowledge that it is my responsibility to provide information about my medical history, conditions and care that is complete and accurate to the best of my ability. I acknowledge that Practitioner's advice, recommendations, and/or decision may be based on factors not within their control, such as incomplete or inaccurate data provided by me or distortions of diagnostic images or specimens that may result from electronic transmissions. I understand that the practice of medicine is not an exact science and that Practitioner makes no warranties or guarantees regarding treatment outcomes. I acknowledge that a copy of this consent can be made available to me via my patient portal [facility] MyChart), or I can request a printed copy by calling the office of [facility].    I understand that my insurance will be billed for this visit.   I have read or had this consent read to me. I understand the contents of this consent, which adequately explains the benefits and risks of the Services being provided via telemedicine.  I have been provided ample opportunity to ask questions regarding this consent and the Services and have had my questions answered to my satisfaction. I give my informed consent for the services to be provided through the use of telemedicine in my medical care

## 2024-01-07 NOTE — Telephone Encounter (Signed)
Pt has been scheduled tele preop appt 01/13/24 ok per Robin Searing , NP to add on due to procedure date and med hold.

## 2024-01-07 NOTE — Telephone Encounter (Signed)
   Name: Kenneth Warren  DOB: 01-12-1976  MRN: [id number]  Primary Cardiologist: Verne Carrow, MD   Preoperative team, please contact this patient and set up a phone call appointment for further preoperative risk assessment. Please obtain consent and complete medication review. Thank you for your help.  I confirm that guidance regarding antiplatelet and oral anticoagulation therapy has been completed and, if necessary, noted below.  Per protocol patient can hold ASA 81 mg 5 to 7 days prior to procedure.  I also confirmed the patient resides in the state of [place]. As per [facility] Medical Board telemedicine laws, the patient must reside in the state in which the provider is licensed.   Napoleon Form, Leodis Rains, NP 01/07/2024, 4:18 PM  [facility]

## 2024-01-13 ENCOUNTER — Ambulatory Visit: Payer: 59 | Attending: Physician Assistant | Admitting: Physician Assistant

## 2024-01-13 DIAGNOSIS — Z01810 Encounter for preprocedural cardiovascular examination: Secondary | ICD-10-CM

## 2024-01-13 NOTE — Progress Notes (Signed)
Surgical Instructions   Your procedure is scheduled on Wednesday, February 5th, 2025. Report to [facility] Main Entrance "A" at 11:00 A.M., then check in with the Admitting office. Any questions or running late day of surgery: call [phone]  Questions prior to your surgery date: call [phone], Monday-Friday, 8am-4pm. If you experience any cold or flu symptoms such as cough, fever, chills, shortness of breath, etc. between now and your scheduled surgery, please notify us at the above number.     Remember:  Do not eat after midnight the night before your surgery  You may drink clear liquids until 11:00 the morning of your surgery.   Clear liquids allowed are: Water, Non-Citrus Juices (without pulp), Carbonated Beverages, Clear Tea (no milk, honey, etc.), Black Coffee Only (NO MILK, CREAM OR POWDERED CREAMER of any kind), and Gatorade.  Patient Instructions  The night before surgery:  No food after midnight. ONLY clear liquids after midnight  The day of surgery (if you do NOT have diabetes):  Drink ONE (1) Pre-Surgery Clear Ensure by 11:00 the morning of surgery. Drink in one sitting. Do not sip.  This drink was given to you during your hospital  pre-op appointment visit.  Nothing else to drink after completing the  Pre-Surgery Clear Ensure.          If you have questions, please contact your surgeon's office.     Take these medicines the morning of surgery with A SIP OF WATER: Rosuvastatin (Crestor)   May take these medicines IF NEEDED: Methocarbamol (Robaxin)  Please follow your surgeon's instructions on when to stop your Aspirin.  If you have not received any instructions, please reach out to your surgeon's office.   One week prior to surgery, STOP taking Aleve, Naproxen, Ibuprofen, Motrin, Advil, Goody's, BC's, all herbal medications, fish oil, and non-prescription vitamins.                     Do NOT Smoke (Tobacco/Vaping) for 24 hours prior to your  procedure.  If you use a CPAP at night, you may bring your mask/headgear for your overnight stay.   You will be asked to remove any contacts, glasses, piercing's, hearing aid's, dentures/partials prior to surgery. Please bring cases for these items if needed.    Patients discharged the day of surgery will not be allowed to drive home, and someone needs to stay with them for 24 hours.  SURGICAL WAITING ROOM VISITATION Patients may have no more than 2 support people in the waiting area - these visitors may rotate.   Pre-op nurse will coordinate an appropriate time for 1 ADULT support person, who may not rotate, to accompany patient in pre-op.  Children under the age of 49 must have an adult with them who is not the patient and must remain in the main waiting area with an adult.  If the patient needs to stay at the hospital during part of their recovery, the visitor guidelines for inpatient rooms apply.  Please refer to the [facility] website for the visitor guidelines for any additional information.   If you received a COVID test during your pre-op visit  it is requested that you wear a mask when out in public, stay away from anyone that may not be feeling well and notify your surgeon if you develop symptoms. If you have been in contact with anyone that has tested positive in the last 10 days please notify you surgeon.      Pre-operative 5  CHG Bathing Instructions   You can play a key role in reducing the risk of infection after surgery. Your skin needs to be as free of germs as possible. You can reduce the number of germs on your skin by washing with CHG (chlorhexidine gluconate) soap before surgery. CHG is an antiseptic soap that kills germs and continues to kill germs even after washing.   DO NOT use if you have an allergy to chlorhexidine/CHG or antibacterial soaps. If your skin becomes reddened or irritated, stop using the CHG and notify one of our RNs at [phone].   Please  shower with the CHG soap starting 4 days before surgery using the following schedule:     Please keep in mind the following:  DO NOT shave, including legs and underarms, starting the day of your first shower.   You may shave your face at any point before/day of surgery.  Place clean sheets on your bed the day you start using CHG soap. Use a clean washcloth (not used since being washed) for each shower. DO NOT sleep with pets once you start using the CHG.   CHG Shower Instructions:  Wash your face and private area with normal soap. If you choose to wash your hair, wash first with your normal shampoo.  After you use shampoo/soap, rinse your hair and body thoroughly to remove shampoo/soap residue.  Turn the water OFF and apply about 3 tablespoons (45 ml) of CHG soap to a CLEAN washcloth.  Apply CHG soap ONLY FROM YOUR NECK DOWN TO YOUR TOES (washing for 3-5 minutes)  DO NOT use CHG soap on face, private areas, open wounds, or sores.  Pay special attention to the area where your surgery is being performed.  If you are having back surgery, having someone wash your back for you may be helpful. Wait 2 minutes after CHG soap is applied, then you may rinse off the CHG soap.  Pat dry with a clean towel  Put on clean clothes/pajamas   If you choose to wear lotion, please use ONLY the CHG-compatible lotions that are listed below.  Additional instructions for the day of surgery: DO NOT APPLY any lotions, deodorants, cologne, or perfumes.   Do not bring valuables to the hospital. [facility] is not responsible for any belongings/valuables. Do not wear nail polish, gel polish, artificial nails, or any other type of covering on natural nails (fingers and toes) Do not wear jewelry or makeup Put on clean/comfortable clothes.  Please brush your teeth.  Ask your nurse before applying any prescription medications to the skin.     CHG Compatible Lotions   Aveeno Moisturizing lotion  Cetaphil  Moisturizing Cream  Cetaphil Moisturizing Lotion  Clairol Herbal Essence Moisturizing Lotion, Dry Skin  Clairol Herbal Essence Moisturizing Lotion, Extra Dry Skin  Clairol Herbal Essence Moisturizing Lotion, Normal Skin  Curel Age Defying Therapeutic Moisturizing Lotion with Alpha Hydroxy  Curel Extreme Care Body Lotion  Curel Soothing Hands Moisturizing Hand Lotion  Curel Therapeutic Moisturizing Cream, Fragrance-Free  Curel Therapeutic Moisturizing Lotion, Fragrance-Free  Curel Therapeutic Moisturizing Lotion, Original Formula  Eucerin Daily Replenishing Lotion  Eucerin Dry Skin Therapy Plus Alpha Hydroxy Crme  Eucerin Dry Skin Therapy Plus Alpha Hydroxy Lotion  Eucerin Original Crme  Eucerin Original Lotion  Eucerin Plus Crme Eucerin Plus Lotion  Eucerin TriLipid Replenishing Lotion  Keri Anti-Bacterial Hand Lotion  Keri Deep Conditioning Original Lotion Dry Skin Formula Softly Scented  Keri Deep Conditioning Original Lotion, Fragrance Free Sensitive Skin Formula  Keri Lotion Fast [employer] Fragrance Free Sensitive Skin Formula  Keri Lotion Fast Absorbing Softly Scented Dry Skin Formula  Keri Original Lotion  Keri Skin Renewal Lotion Keri Silky Smooth Lotion  Keri Silky Smooth Sensitive Skin Lotion  Nivea Body Creamy Conditioning Oil  Nivea Body Extra Enriched Lotion  Nivea Body Original Lotion  Nivea Body Sheer Moisturizing Lotion Nivea Crme  Nivea Skin Firming Lotion  NutraDerm 30 Skin Lotion  NutraDerm Skin Lotion  NutraDerm Therapeutic Skin Cream  NutraDerm Therapeutic Skin Lotion  ProShield Protective Hand Cream  Provon moisturizing lotion  Please read over the following fact sheets that you were given.

## 2024-01-13 NOTE — Progress Notes (Signed)
   Virtual Visit via Telephone Note   Because of Kenneth Warren's co-morbid illnesses, he is at least at moderate risk for complications without adequate follow up.  This format is felt to be most appropriate for this patient at this time.  The patient did not have access to video technology/had technical difficulties with video requiring transitioning to audio format only (telephone).  All issues noted in this document were discussed and addressed.  No physical exam could be performed with this format.  Please refer to the patient's chart for his consent to telehealth for [facility].  Evaluation Performed:  Preoperative cardiovascular risk assessment _____________   Date:  01/13/2024   Patient ID:  Kenneth Warren, DOB 21-Oct-1976, MRN [id number] Patient Location:  Home - [place] Provider location:   Office  Primary Care Provider:  Benita Stabile, MD Primary Cardiologist:  Verne Carrow, MD  Chief Complaint / Patient Profile   48 y.o. y/o male with a h/o   Coronary artery disease CCTA 09/21/2023: CAC score 200 (96th percentile), TPV 247 mm (209 mm noncalcific plaque, 5 mm low-attenuation plaque); mild to moderate nonobstructive CAD-RCA 25-49, proximal LAD 50-69, mid LAD 1-24>> FFR normal Hyperlipidemia  He is pending cervical spine surgery on 01/21/2024 with Dr. Yevette Edwards (type of anesthesia not known) and presents today for telephonic preoperative cardiovascular risk assessment.    History of Present Illness    Kenneth Warren is a 48 y.o. male who presents via audio/video conferencing for a telehealth visit today.  Pt was last seen in cardiology clinic on 09/22/2023 by Dr. Clifton James.  At that time Kenneth Warren was doing well.  The patient is now pending procedure as outlined above. Since his last visit, he has been doing well without chest pain or shortness of breath.  He remains active.     Physical Exam    Vital Signs:  Kenneth Warren does not have vital signs available for review today.  Given telephonic nature of communication, physical exam is limited. AAOx3. NAD. Normal affect.  Speech and respirations are unlabored.  Accessory Clinical Findings    None    Assessment & Plan    Assessment & Plan Preoperative cardiovascular examination Mr. Jantz perioperative risk of a major cardiac event is 0.4% according to the Revised Cardiac Risk Index (RCRI).  Therefore, he is at low risk for perioperative complications.   His functional capacity is good at 4.31 METs according to the Duke Activity Status Index (DASI). Recommendations: According to ACC/AHA guidelines, no further cardiovascular testing needed.  The patient may proceed to surgery at acceptable risk.   Antiplatelet and/or Anticoagulation Recommendations: Aspirin can be held for 7 days prior to his surgery.  Please resume Aspirin post operatively when it is felt to be safe from a bleeding standpoint.    A copy of this note will be routed to requesting surgeon.  Time:   Today, I have spent 4 minutes with the patient with telehealth technology discussing medical history, symptoms, and management plan.     Tereso Newcomer, PA-C  01/13/2024, 5:46 PM

## 2024-01-13 NOTE — Telephone Encounter (Signed)
Notes faxed to surgeon. This phone note will be removed from the preop pool. Tereso Newcomer, PA-C  01/13/2024 5:47 PM

## 2024-01-14 ENCOUNTER — Other Ambulatory Visit: Payer: Self-pay

## 2024-01-14 ENCOUNTER — Encounter (HOSPITAL_COMMUNITY)
Admission: RE | Admit: 2024-01-14 | Discharge: 2024-01-14 | Disposition: A | Discharge status: Home / Self Care | Payer: 59 | Source: Ambulatory Visit | Attending: Orthopedic Surgery | Admitting: Orthopedic Surgery

## 2024-01-14 ENCOUNTER — Encounter (HOSPITAL_COMMUNITY): Payer: Self-pay

## 2024-01-14 VITALS — BP 129/87 | HR 72 | Temp 98.0°F | Resp 18 | Ht 71.0 in | Wt 195.8 lb

## 2024-01-14 DIAGNOSIS — Z87891 Personal history of nicotine dependence: Secondary | ICD-10-CM | POA: Insufficient documentation

## 2024-01-14 DIAGNOSIS — I2510 Atherosclerotic heart disease of native coronary artery without angina pectoris: Secondary | ICD-10-CM | POA: Insufficient documentation

## 2024-01-14 DIAGNOSIS — M5412 Radiculopathy, cervical region: Secondary | ICD-10-CM | POA: Diagnosis not present

## 2024-01-14 DIAGNOSIS — Z01812 Encounter for preprocedural laboratory examination: Secondary | ICD-10-CM | POA: Insufficient documentation

## 2024-01-14 DIAGNOSIS — Z01818 Encounter for other preprocedural examination: Secondary | ICD-10-CM

## 2024-01-14 HISTORY — DX: Atherosclerotic heart disease of native coronary artery without angina pectoris: I25.10

## 2024-01-14 HISTORY — DX: Unspecified osteoarthritis, unspecified site: M19.90

## 2024-01-14 LAB — CBC
HCT: 51.9 % (ref 39.0–52.0)
Hemoglobin: 18.1 g/dL — ABNORMAL HIGH (ref 13.0–17.0)
MCH: 31.1 pg (ref 26.0–34.0)
MCHC: 34.9 g/dL (ref 30.0–36.0)
MCV: 89.2 fL (ref 80.0–100.0)
Platelets: 202 10*3/uL (ref 150–400)
RBC: 5.82 MIL/uL — ABNORMAL HIGH (ref 4.22–5.81)
RDW: 12.4 % (ref 11.5–15.5)
WBC: 6.9 10*3/uL (ref 4.0–10.5)
nRBC: 0 % (ref 0.0–0.2)

## 2024-01-14 LAB — SURGICAL PCR SCREEN
MRSA, PCR: NEGATIVE
Staphylococcus aureus: NEGATIVE

## 2024-01-14 LAB — BASIC METABOLIC PANEL
Anion gap: 9 (ref 5–15)
BUN: 13 mg/dL (ref 6–20)
CO2: 26 mmol/L (ref 22–32)
Calcium: 9.5 mg/dL (ref 8.9–10.3)
Chloride: 103 mmol/L (ref 98–111)
Creatinine, Ser: 1 mg/dL (ref 0.61–1.24)
GFR, Estimated: >60 mL/min (ref >60)
Glucose, Bld: 76 mg/dL (ref 70–99)
Potassium: 3.8 mmol/L (ref 3.5–5.1)
Sodium: 138 mmol/L (ref 135–145)

## 2024-01-14 NOTE — Progress Notes (Signed)
PCP - Dr. Kathleene Hazel. [place] Cardiologist - Dr. Verne Carrow  PPM/ICD - denies   Chest x-ray - denies EKG - 09/22/23 Stress Test - denies ECHO - denies Cardiac Cath - denies  Sleep Study - pt has had sleep study before and was OSA-   DM- denies  Last dose of GLP1 agonist-  n/a   Blood Thinner Instructions: n/a Aspirin Instructions: Hold 7 days (Last dose 1/28)  ERAS Protcol - yes PRE-SURGERY Ensure given at PAT  COVID TEST- n/a   Anesthesia review: yes, cardiac hx  Patient denies shortness of breath, fever, cough and chest pain at PAT appointment   All instructions explained to the patient, with a verbal understanding of the material. Patient agrees to go over the instructions while at home for a better understanding. The opportunity to ask questions was provided.

## 2024-01-15 NOTE — Progress Notes (Signed)
Anesthesia Chart Review:  Case: [id number] Date/Time: 01/21/24 1345   Procedure: ANTERIOR CERVICAL DECOMPRESSION FUSION CERVICAL 5- CERVICAL 6, CERVICAL 6- CERVICAL 7 WITH INSTRUMENTATION AND ALLOGRAFT   Anesthesia type: General   Pre-op diagnosis: CERVICAL RADICULOPATHY   [facility]OM [facility] / [facility]   Surgeons: Estill Bamberg, MD       DISCUSSION: Patient is a 48 year old male scheduled for the above procedure.  History includes former smoker (quit 09/13/23), CAD (CAC 200, 96th percentile, no significant stenosis by CCTA FFR 10/01/23), nephrolithiasis, Meckel's diverticulectomy (05/09/22).   Preoperative telephonic cardiology evaluation on 01/13/24 by Tereso Newcomer, PA-C, "Mr. Sefcik's perioperative risk of a major cardiac event is 0.4% according to the Revised Cardiac Risk Index (RCRI).  Therefore, he is at low risk for perioperative complications.   His functional capacity is good at 4.31 METs according to the Duke Activity Status Index (DASI). Recommendations: According to ACC/AHA guidelines, no further cardiovascular testing needed.  The patient may proceed to surgery at acceptable risk.   Antiplatelet and/or Anticoagulation Recommendations: Aspirin can be held for 7 days prior to his surgery.  Please resume Aspirin post operatively when it is felt to be safe from a bleeding standpoint.   Last ASA 01/12/23.   Anesthesia team to evaluate on the day of surgery.    VS: BP 129/87   Pulse 72   Temp 36.7 C   Resp 18   Ht 5\' 11"  (1.803 m)   Wt 88.8 kg   SpO2 99%   BMI 27.31 kg/m    PROVIDERS: Benita Stabile, MD is PCP  Verne Carrow, MD is cardiologist. Established 09/22/23 for elevated CAC.    LABS: Preoperative labs noted. HGB 18.1, HCT 51.9, previously 15.6/46.2 on 05/12/22. LFTs normal 12/19/23.  (all labs ordered are listed, but only abnormal results are displayed)  Labs Reviewed  CBC - Abnormal; Notable for the following components:      Result Value   RBC 5.82  (*)    Hemoglobin 18.1 (*)    All other components within normal limits  SURGICAL PCR SCREEN  BASIC METABOLIC PANEL    IMAGES: CT Chest (over read CT Cardiac Calicum scoring) 08/05/23: IMPRESSION: Tiny pulmonary nodules, largest measuring 4 mm.   Per Fleischner Society Guidelines, if patient is low risk for malignancy, no routine follow-up imaging is recommended. If patient is high risk for malignancy, a non-contrast Chest CT at 12 months is optional. If performed and the nodule is stable at 12 months, no further follow-up is recommended.   These guidelines do not apply to immunocompromised patients and patients with cancer. Follow up in patients with significant comorbidities as clinically warranted. For lung cancer screening, adhere to Lung-RADS guidelines. Reference: Radiology. 2017; 284(1):228-43.   EKG: 09/22/23: NSR   CV: CT Coronary 10/01/23: Coronary calcium score: The patient's coronary artery calcium score is 200, which places the patient in the 96th percentile.   Coronary arteries: Normal coronary origins.  Right dominance.   Right Coronary Artery: Dominant. Minimal to mild mixed proximal and distal vessel disease (25-49%). Small R-PDA and R-PLB branches.   Left Main Coronary Artery: Normal. Bifurcates into the LAD and LCX arteries.   Left Anterior Descending Coronary Artery: Large anterior artery that reaches the apex. There is a complex mixed plaque of the proximal vessel with low attenuation features and probably moderate 50-69% stenosis. There is minimal 1-24% diffuse mid to distal vessel stenosis. Moderate-sized D1 branch without significant stenosis.   Left Circumflex  Artery: AV groove LCX vessel without disease. Large proximal OM1 branch which feeds the lateral wall - no disease. Smaller OM2 branch without disease.   Aorta: Normal size, 30 mm at the mid ascending aorta (level of the PA bifurcation) measured double oblique. No calcifications.  No dissection.   Aortic Valve: Trileaflet. No calcifications.   Other findings:   Normal pulmonary vein drainage into the left atrium.   Normal left atrial appendage without a thrombus.   Normal size of the pulmonary artery.   IMPRESSION: 1. Mild to moderate mixed CAD, CADRADS = 3v. CT FFR will be performed and reported separately.   2. Coronary artery calcium score is 200, which places the patient in the 96th percentile for age/race and sex-matched control.   3. Normal coronary origin with right dominance.   4. Total plaque volume 263 mm3 (severe). 247 mm3 noted in the LAD, with 209 mm3 (85%) being non-calcified plaque and 5 mm3 (2%) being low-attenuation plaque.   5. Aggressive cardiovascular risk factor modification is recommended. Recommend high intensity statin and/or combination therapy to target LDL <55 and consider evaluation for elevated HS-CRP.  FFR 10/01/23: 1. Left Main:  No significant stenosis. FFR = 1.00 2. LAD: No significant stenosis. Proximal FFR = 0.96, Mid FFR = 0.90, Distal FFR = 0.84 3. LCX: No significant stenosis. Proximal FFR = 0.98, Distal FFR = 0.95 4. RCA: No significant stenosis. Proximal FFR = 1.00, Mid FFR = 0.94, Distal FFR = 0.93   IMPRESSION: 1. CT FFR analysis did not show any significant discrete stenosis (<0.75).      Past Medical History:  Diagnosis Date   Arthritis    Coronary artery disease    Kidney stones     Past Surgical History:  Procedure Laterality Date   LAPAROSCOPIC APPENDECTOMY N/A 05/09/2022   Procedure: Meckel's diverticulectomy Open, attempted Laparoscopic;  Surgeon: Franky Macho, MD;  Location: [facility];  Service: General;  Laterality: N/A;   SHOULDER SURGERY Right    URETERAL STENT PLACEMENT      MEDICATIONS:  aspirin EC 81 MG tablet   methocarbamol (ROBAXIN) 500 MG tablet   metoprolol tartrate (LOPRESSOR) 50 MG tablet   oxyCODONE (OXY IR/ROXICODONE) 5 MG immediate release tablet   predniSONE  (STERAPRED UNI-PAK 21 TAB) 10 MG (21) TBPK tablet   rosuvastatin (CRESTOR) 20 MG tablet   tamsulosin (FLOMAX) 0.4 MG CAPS capsule   No current facility-administered medications for this encounter.   By medication list, he is no longer on Lopressor, Oxy IR, prednisone, Flomax.   Shonna Chock, PA-C Surgical Short Stay/Anesthesiology [facility] Phone [phone] [facility] Phone [phone] 01/15/2024 4:31 PM

## 2024-01-15 NOTE — Anesthesia Preprocedure Evaluation (Addendum)
Anesthesia Evaluation    Reviewed: Allergy & Precautions, H&P , Patient's Chart, lab work & pertinent test results  Airway        Dental   Pulmonary neg pulmonary ROS, Patient abstained from smoking., former smoker          Cardiovascular Exercise Tolerance: Good + CAD  negative cardio ROS      Neuro/Psych negative neurological ROS  negative psych ROS   GI/Hepatic negative GI ROS, Neg liver ROS,,,  Endo/Other  negative endocrine ROS    Renal/GU negative Renal ROS  negative genitourinary   Musculoskeletal   Abdominal   Peds  Hematology negative hematology ROS (+)   Anesthesia Other Findings   Reproductive/Obstetrics negative OB ROS                             Anesthesia Physical Anesthesia Plan  ASA: 2  Anesthesia Plan: General   Post-op Pain Management: Tylenol PO (pre-op)*   Induction: Intravenous  PONV Risk Score and Plan: 3 and Ondansetron, Dexamethasone and Midazolam  Airway Management Planned: Oral ETT  Additional Equipment:   Intra-op Plan:   Post-operative Plan: Extubation in OR  Informed Consent: I have reviewed the patients History and Physical, chart, labs and discussed the procedure including the risks, benefits and alternatives for the proposed anesthesia with the patient or authorized representative who has indicated his/her understanding and acceptance.       Plan Discussed with:   Anesthesia Plan Comments: (PAT note written 01/15/2024 by Shonna Chock, PA-C.  )       Anesthesia Quick Evaluation

## 2024-01-19 DIAGNOSIS — M5412 Radiculopathy, cervical region: Secondary | ICD-10-CM | POA: Diagnosis not present

## 2024-01-20 NOTE — Progress Notes (Signed)
Patient was called to be informed that the surgery time for tomorrow was changed to 08:30 o'clock. Patient was instructed to be at the hospital at 06:30 o'clock and stop drinking clear liquids at 05:30 o'clock. Patient verbalized understanding.

## 2024-01-21 ENCOUNTER — Encounter (HOSPITAL_COMMUNITY)
Admission: RE | Disposition: A | Discharge status: Home / Self Care | Payer: Self-pay | Source: Ambulatory Visit | Attending: Orthopedic Surgery

## 2024-01-21 ENCOUNTER — Ambulatory Visit (HOSPITAL_COMMUNITY)
Admission: RE | Admit: 2024-01-21 | Discharge: 2024-01-21 | Disposition: A | Discharge status: Home / Self Care | Payer: 59 | Source: Ambulatory Visit | Attending: Orthopedic Surgery | Admitting: Orthopedic Surgery

## 2024-01-21 ENCOUNTER — Other Ambulatory Visit (HOSPITAL_COMMUNITY): Payer: Self-pay

## 2024-01-21 ENCOUNTER — Ambulatory Visit (HOSPITAL_COMMUNITY): Payer: 59

## 2024-01-21 ENCOUNTER — Encounter (HOSPITAL_COMMUNITY): Payer: Self-pay | Admitting: Orthopedic Surgery

## 2024-01-21 ENCOUNTER — Ambulatory Visit (HOSPITAL_COMMUNITY): Payer: 59 | Admitting: Vascular Surgery

## 2024-01-21 ENCOUNTER — Other Ambulatory Visit: Payer: Self-pay

## 2024-01-21 ENCOUNTER — Ambulatory Visit (HOSPITAL_BASED_OUTPATIENT_CLINIC_OR_DEPARTMENT_OTHER): Payer: 59 | Admitting: Certified Registered Nurse Anesthetist

## 2024-01-21 DIAGNOSIS — Z87891 Personal history of nicotine dependence: Secondary | ICD-10-CM | POA: Insufficient documentation

## 2024-01-21 DIAGNOSIS — M5010 Cervical disc disorder with radiculopathy, unspecified cervical region: Secondary | ICD-10-CM

## 2024-01-21 DIAGNOSIS — M5412 Radiculopathy, cervical region: Secondary | ICD-10-CM | POA: Diagnosis not present

## 2024-01-21 DIAGNOSIS — I2510 Atherosclerotic heart disease of native coronary artery without angina pectoris: Secondary | ICD-10-CM | POA: Insufficient documentation

## 2024-01-21 DIAGNOSIS — M4802 Spinal stenosis, cervical region: Secondary | ICD-10-CM | POA: Insufficient documentation

## 2024-01-21 DIAGNOSIS — Z981 Arthrodesis status: Secondary | ICD-10-CM | POA: Diagnosis not present

## 2024-01-21 DIAGNOSIS — G9520 Unspecified cord compression: Secondary | ICD-10-CM | POA: Insufficient documentation

## 2024-01-21 HISTORY — PX: ANTERIOR CERVICAL DECOMP/DISCECTOMY FUSION: SHX1161

## 2024-01-21 SURGERY — ANTERIOR CERVICAL DECOMPRESSION/DISCECTOMY FUSION 2 LEVELS
Anesthesia: General | Site: Spine Cervical

## 2024-01-21 MED ORDER — ACETAMINOPHEN 500 MG PO TABS
1000.0000 mg | ORAL_TABLET | Freq: Once | ORAL | Status: AC
  Administered 2024-01-21: 1000 mg via ORAL
  Filled 2024-01-21: qty 2

## 2024-01-21 MED ORDER — ONDANSETRON HCL 4 MG/2ML IJ SOLN
INTRAMUSCULAR | Status: AC
  Filled 2024-01-21: qty 2

## 2024-01-21 MED ORDER — STERILE WATER FOR IRRIGATION IR SOLN
Status: DC | PRN
  Administered 2024-01-21: 1000 mL

## 2024-01-21 MED ORDER — METHOCARBAMOL 500 MG PO TABS
500.0000 mg | ORAL_TABLET | Freq: Four times a day (QID) | ORAL | 2 refills | Status: AC | PRN
  Filled 2024-01-21 – 2024-03-12 (×2): qty 60, 8d supply, fill #0

## 2024-01-21 MED ORDER — CEFAZOLIN SODIUM-DEXTROSE 2-4 GM/100ML-% IV SOLN
2.0000 g | INTRAVENOUS | Status: AC
  Administered 2024-01-21: 2 g via INTRAVENOUS
  Filled 2024-01-21: qty 100

## 2024-01-21 MED ORDER — PHENYLEPHRINE HCL-NACL 20-0.9 MG/250ML-% IV SOLN
INTRAVENOUS | Status: DC | PRN
  Administered 2024-01-21: 25 ug/min via INTRAVENOUS

## 2024-01-21 MED ORDER — ROCURONIUM BROMIDE 10 MG/ML (PF) SYRINGE
PREFILLED_SYRINGE | INTRAVENOUS | Status: AC
  Filled 2024-01-21: qty 10

## 2024-01-21 MED ORDER — ROCURONIUM BROMIDE 10 MG/ML (PF) SYRINGE
PREFILLED_SYRINGE | INTRAVENOUS | Status: DC | PRN
  Administered 2024-01-21: 20 mg via INTRAVENOUS
  Administered 2024-01-21: 100 mg via INTRAVENOUS

## 2024-01-21 MED ORDER — ONDANSETRON HCL 4 MG/2ML IJ SOLN
INTRAMUSCULAR | Status: DC | PRN
  Administered 2024-01-21: 4 mg via INTRAVENOUS

## 2024-01-21 MED ORDER — OXYCODONE HCL 5 MG PO TABS
5.0000 mg | ORAL_TABLET | Freq: Once | ORAL | Status: AC
  Administered 2024-01-21: 5 mg via ORAL

## 2024-01-21 MED ORDER — 0.9 % SODIUM CHLORIDE (POUR BTL) OPTIME
TOPICAL | Status: DC | PRN
  Administered 2024-01-21: 1000 mL

## 2024-01-21 MED ORDER — PROPOFOL 10 MG/ML IV BOLUS
INTRAVENOUS | Status: AC
  Filled 2024-01-21: qty 20

## 2024-01-21 MED ORDER — BUPIVACAINE-EPINEPHRINE (PF) 0.25% -1:200000 IJ SOLN
INTRAMUSCULAR | Status: AC
  Filled 2024-01-21: qty 30

## 2024-01-21 MED ORDER — OXYCODONE HCL 5 MG PO TABS
ORAL_TABLET | ORAL | Status: AC
  Filled 2024-01-21: qty 1

## 2024-01-21 MED ORDER — DEXAMETHASONE SODIUM PHOSPHATE 10 MG/ML IJ SOLN
INTRAMUSCULAR | Status: AC
  Filled 2024-01-21: qty 1

## 2024-01-21 MED ORDER — MIDAZOLAM HCL 2 MG/2ML IJ SOLN
INTRAMUSCULAR | Status: DC | PRN
  Administered 2024-01-21: 2 mg via INTRAVENOUS

## 2024-01-21 MED ORDER — FENTANYL CITRATE (PF) 250 MCG/5ML IJ SOLN
INTRAMUSCULAR | Status: DC | PRN
  Administered 2024-01-21 (×7): 50 ug via INTRAVENOUS

## 2024-01-21 MED ORDER — METHOCARBAMOL 500 MG PO TABS
500.0000 mg | ORAL_TABLET | Freq: Once | ORAL | Status: AC
  Administered 2024-01-21: 500 mg via ORAL

## 2024-01-21 MED ORDER — PHENYLEPHRINE 80 MCG/ML (10ML) SYRINGE FOR IV PUSH (FOR BLOOD PRESSURE SUPPORT)
PREFILLED_SYRINGE | INTRAVENOUS | Status: AC
  Filled 2024-01-21: qty 10

## 2024-01-21 MED ORDER — PHENYLEPHRINE 80 MCG/ML (10ML) SYRINGE FOR IV PUSH (FOR BLOOD PRESSURE SUPPORT)
PREFILLED_SYRINGE | INTRAVENOUS | Status: DC | PRN
  Administered 2024-01-21 (×3): 80 ug via INTRAVENOUS

## 2024-01-21 MED ORDER — ORAL CARE MOUTH RINSE: 15.0000 mL | Freq: Once | OROMUCOSAL | Status: AC

## 2024-01-21 MED ORDER — SUGAMMADEX SODIUM 200 MG/2ML IV SOLN
INTRAVENOUS | Status: DC | PRN
  Administered 2024-01-21: 200 mg via INTRAVENOUS

## 2024-01-21 MED ORDER — HYDROMORPHONE HCL 1 MG/ML IJ SOLN
0.2500 mg | INTRAMUSCULAR | Status: DC | PRN
  Administered 2024-01-21 (×2): 0.5 mg via INTRAVENOUS

## 2024-01-21 MED ORDER — LIDOCAINE 2% (20 MG/ML) 5 ML SYRINGE
INTRAMUSCULAR | Status: DC | PRN
  Administered 2024-01-21: 100 mg via INTRAVENOUS

## 2024-01-21 MED ORDER — METHOCARBAMOL 1000 MG/10ML IJ SOLN: 500.0000 mg | Freq: Once | INTRAMUSCULAR | Status: DC

## 2024-01-21 MED ORDER — BUPIVACAINE-EPINEPHRINE 0.25% -1:200000 IJ SOLN
INTRAMUSCULAR | Status: DC | PRN
  Administered 2024-01-21: 6 mL

## 2024-01-21 MED ORDER — DEXAMETHASONE SODIUM PHOSPHATE 10 MG/ML IJ SOLN
INTRAMUSCULAR | Status: DC | PRN
  Administered 2024-01-21: 10 mg via INTRAVENOUS

## 2024-01-21 MED ORDER — FENTANYL CITRATE (PF) 250 MCG/5ML IJ SOLN
INTRAMUSCULAR | Status: AC
  Filled 2024-01-21: qty 5

## 2024-01-21 MED ORDER — LIDOCAINE 2% (20 MG/ML) 5 ML SYRINGE
INTRAMUSCULAR | Status: AC
  Filled 2024-01-21: qty 5

## 2024-01-21 MED ORDER — MIDAZOLAM HCL 2 MG/2ML IJ SOLN
INTRAMUSCULAR | Status: AC
Start: 2024-01-21 — End: ?
  Filled 2024-01-21: qty 2

## 2024-01-21 MED ORDER — THROMBIN 20000 UNITS EX SOLR
CUTANEOUS | Status: AC
  Filled 2024-01-21: qty 20000

## 2024-01-21 MED ORDER — METHOCARBAMOL 500 MG PO TABS
ORAL_TABLET | ORAL | Status: AC
  Filled 2024-01-21: qty 1

## 2024-01-21 MED ORDER — HYDROCODONE-ACETAMINOPHEN 5-325 MG PO TABS
1.0000 | ORAL_TABLET | Freq: Four times a day (QID) | ORAL | 0 refills | Status: AC | PRN
  Filled 2024-01-21: qty 20, 5d supply, fill #0

## 2024-01-21 MED ORDER — HYDROMORPHONE HCL 1 MG/ML IJ SOLN
INTRAMUSCULAR | Status: AC
  Filled 2024-01-21: qty 1

## 2024-01-21 MED ORDER — POVIDONE-IODINE 7.5 % EX SOLN
Freq: Once | CUTANEOUS | Status: DC
  Filled 2024-01-21: qty 118

## 2024-01-21 MED ORDER — THROMBIN 20000 UNITS EX KIT
PACK | CUTANEOUS | Status: DC | PRN
  Administered 2024-01-21: 20 mL via TOPICAL

## 2024-01-21 MED ORDER — PROPOFOL 10 MG/ML IV BOLUS
INTRAVENOUS | Status: DC | PRN
  Administered 2024-01-21: 160 mg via INTRAVENOUS

## 2024-01-21 MED ORDER — CHLORHEXIDINE GLUCONATE 0.12 % MT SOLN
15.0000 mL | Freq: Once | OROMUCOSAL | Status: AC
  Administered 2024-01-21: 15 mL via OROMUCOSAL
  Filled 2024-01-21: qty 15

## 2024-01-21 MED ORDER — LACTATED RINGERS IV SOLN
INTRAVENOUS | Status: DC
Start: 2024-01-21 — End: 2024-01-21

## 2024-01-21 SURGICAL SUPPLY — 66 items
BAG COUNTER SPONGE SURGICOUNT (BAG) ×1 IMPLANT
BENZOIN TINCTURE PRP APPL 2/3 (GAUZE/BANDAGES/DRESSINGS) ×1 IMPLANT
BIT DRILL NEURO 2X3.1 SFT TUCH (MISCELLANEOUS) ×1 IMPLANT
BIT DRILL SRG 14X2.2XFLT CHK (BIT) IMPLANT
BIT DRL SRG 14X2.2XFLT CHK (BIT) ×1
BLADE CLIPPER SURG (BLADE) ×1 IMPLANT
BLADE SURG 15 STRL LF DISP TIS (BLADE) ×1 IMPLANT
BONE CERV LORDOTIC 14.5X12X7 (Bone Implant) ×2 IMPLANT
COLLAR CERV LO CONTOUR FIRM DE (SOFTGOODS) IMPLANT
CORD BIPOLAR FORCEPS 12FT (ELECTRODE) ×1 IMPLANT
COVER SURGICAL LIGHT HANDLE (MISCELLANEOUS) ×1 IMPLANT
DRAIN JACKSON RD 7FR 3/32 (WOUND CARE) IMPLANT
DRAPE C-ARM 42X72 X-RAY (DRAPES) ×1 IMPLANT
DRAPE POUCH INSTRU U-SHP 10X18 (DRAPES) ×1 IMPLANT
DRAPE SURG 17X23 STRL (DRAPES) ×4 IMPLANT
DRILL NEURO 2X3.1 SOFT TOUCH (MISCELLANEOUS) ×1
DURAPREP 26ML APPLICATOR (WOUND CARE) ×1 IMPLANT
ELECT COATED BLADE 2.86 ST (ELECTRODE) ×1 IMPLANT
ELECT PENCIL ROCKER SW 15FT (MISCELLANEOUS) IMPLANT
ELECT REM PT RETURN 9FT ADLT (ELECTROSURGICAL) ×1
ELECTRODE REM PT RTRN 9FT ADLT (ELECTROSURGICAL) ×1 IMPLANT
EVACUATOR SILICONE 100CC (DRAIN) IMPLANT
GAUZE 4X4 16PLY ~~LOC~~+RFID DBL (SPONGE) ×1 IMPLANT
GAUZE SPONGE 4X4 12PLY STRL (GAUZE/BANDAGES/DRESSINGS) ×1 IMPLANT
GLOVE BIO SURGEON STRL SZ 6.5 (GLOVE) ×1 IMPLANT
GLOVE BIO SURGEON STRL SZ8 (GLOVE) ×1 IMPLANT
GLOVE BIOGEL PI IND STRL 7.0 (GLOVE) ×2 IMPLANT
GLOVE BIOGEL PI IND STRL 8 (GLOVE) ×1 IMPLANT
GLOVE SURG ENC MOIS LTX SZ6.5 (GLOVE) ×1 IMPLANT
GOWN STRL REUS W/ TWL LRG LVL3 (GOWN DISPOSABLE) ×1 IMPLANT
GOWN STRL REUS W/ TWL XL LVL3 (GOWN DISPOSABLE) ×1 IMPLANT
GRAFT BNE SPCR VG2 14.5X12X7 (Bone Implant) IMPLANT
IV CATH 14GX2 1/4 (CATHETERS) ×1 IMPLANT
KIT BASIN OR (CUSTOM PROCEDURE TRAY) ×1 IMPLANT
KIT TURNOVER KIT B (KITS) ×1 IMPLANT
MANIFOLD NEPTUNE II (INSTRUMENTS) ×1 IMPLANT
NDL PRECISIONGLIDE 27X1.5 (NEEDLE) ×1 IMPLANT
NDL SPNL 20GX3.5 QUINCKE YW (NEEDLE) ×1 IMPLANT
NEEDLE PRECISIONGLIDE 27X1.5 (NEEDLE) ×1
NEEDLE SPNL 20GX3.5 QUINCKE YW (NEEDLE) ×1
NS IRRIG 1000ML POUR BTL (IV SOLUTION) ×1 IMPLANT
PACK ORTHO CERVICAL (CUSTOM PROCEDURE TRAY) ×1 IMPLANT
PAD ARMBOARD 7.5X6 YLW CONV (MISCELLANEOUS) ×2 IMPLANT
PATTIES SURGICAL .5 X.5 (GAUZE/BANDAGES/DRESSINGS) IMPLANT
PATTIES SURGICAL .5 X1 (DISPOSABLE) ×1 IMPLANT
PIN DISTRACTION 14 (PIN) IMPLANT
PLATE SKYLINE TWO LEVEL 32MM (Plate) IMPLANT
POSITIONER HEAD DONUT 9IN (MISCELLANEOUS) ×1 IMPLANT
SCREW SKYLINE VAR OS 14MM (Screw) IMPLANT
SPIKE FLUID TRANSFER (MISCELLANEOUS) ×1 IMPLANT
SPONGE INTESTINAL PEANUT (DISPOSABLE) ×1 IMPLANT
SPONGE SURGIFOAM ABS GEL 100 (HEMOSTASIS) ×1 IMPLANT
STRIP CLOSURE SKIN 1/2X4 (GAUZE/BANDAGES/DRESSINGS) ×1 IMPLANT
SURGIFLO W/THROMBIN 8M KIT (HEMOSTASIS) IMPLANT
SUT MNCRL AB 4-0 PS2 18 (SUTURE) ×1 IMPLANT
SUT SILK 4-0 18XBRD TIE 12 (SUTURE) IMPLANT
SUT VIC AB 2-0 CT2 18 VCP726D (SUTURE) ×1 IMPLANT
SYR 20ML LL LF (SYRINGE) IMPLANT
SYR BULB IRRIG 60ML STRL (SYRINGE) ×1 IMPLANT
SYR CONTROL 10ML LL (SYRINGE) ×3 IMPLANT
TAPE CLOTH 4X10 WHT NS (GAUZE/BANDAGES/DRESSINGS) ×1 IMPLANT
TAPE UMBILICAL 1/8X30 (MISCELLANEOUS) ×2 IMPLANT
TOWEL GREEN STERILE (TOWEL DISPOSABLE) ×1 IMPLANT
TOWEL GREEN STERILE FF (TOWEL DISPOSABLE) ×1 IMPLANT
WATER STERILE IRR 1000ML POUR (IV SOLUTION) ×1 IMPLANT
YANKAUER SUCT BULB TIP NO VENT (SUCTIONS) ×1 IMPLANT

## 2024-01-21 NOTE — H&P (Signed)
 PREOPERATIVE H&P  Chief Complaint: Right arm pain  HPI: Kenneth Warren is a 48 y.o. male who presents with ongoing pain in the right arm  MRI reveals cord compression and nerve compression at C5/6 and C6/7   Patient has failed multiple forms of conservative care and continues to have pain (see office notes for additional details regarding the patient's full course of treatment)  Past Medical History:  Diagnosis Date   Arthritis    Coronary artery disease    Kidney stones    Past Surgical History:  Procedure Laterality Date   LAPAROSCOPIC APPENDECTOMY N/A 05/09/2022   Procedure: Meckel's diverticulectomy Open, attempted Laparoscopic;  Surgeon: Mavis Anes, MD;  Location: [facility];  Service: General;  Laterality: N/A;   SHOULDER SURGERY Right    URETERAL STENT PLACEMENT     Social History   Socioeconomic History   Marital status: Married    Spouse name: Not on file   Number of children: 1   Years of education: Not on file   Highest education level: Not on file  Occupational History   Occupation: Manages an automotive shop  Tobacco Use   Smoking status: Former    Current packs/day: 0.00    Types: Cigarettes    Quit date: 09/13/2023    Years since quitting: 0.3   Smokeless tobacco: Never  Vaping Use   Vaping status: Never Used  Substance and Sexual Activity   Alcohol use: Yes    Alcohol/week: 2.0 standard drinks of alcohol    Types: 2 Standard drinks or equivalent per week    Comment: maybe a couple drinks on the weekend   Drug use: No   Sexual activity: Not on file  Other Topics Concern   Not on file  Social History Narrative   Not on file   Social Drivers of Health   Financial Resource Strain: Not on file  Food Insecurity: No Food Insecurity (05/24/2022)   Hunger Vital Sign    Worried About Running Out of Food in the Last Year: Never true    Ran Out of Food in the Last Year: Never true  Transportation Needs: No Transportation Needs (05/24/2022)    PRAPARE - Administrator, Civil Service (Medical): No    Lack of Transportation (Non-Medical): No  Physical Activity: Not on file  Stress: Not on file  Social Connections: Not on file   Family History  Problem Relation Age of Onset   CAD Mother    Heart attack Maternal Grandmother    Heart attack Maternal Grandfather    Colon cancer Neg Hx    Colon polyps Neg Hx    Esophageal cancer Neg Hx    Stomach cancer Neg Hx    Rectal cancer Neg Hx    No Known Allergies Prior to Admission medications   Medication Sig Start Date End Date Taking? Authorizing Provider  aspirin  EC 81 MG tablet Take 1 tablet (81 mg total) by mouth daily. Swallow whole. 09/22/23  Yes Verlin Lonni BIRCH, MD  methocarbamol  (ROBAXIN ) 500 MG tablet Take 1 tablet (500 mg total) by mouth every 8 (eight) hours as needed for muscle pain 12/22/23  Yes   rosuvastatin  (CRESTOR ) 20 MG tablet Take 1 tablet (20 mg total) by mouth daily. 12/23/23  Yes Verlin Lonni BIRCH, MD  metoprolol  tartrate (LOPRESSOR ) 50 MG tablet Take 1 tablet (50 mg total) by mouth once for 1 dose. Take 90-120 minutes prior to scan. Hold for SBP less than  110. Patient not taking: Reported on 01/12/2024 09/22/23 01/07/24  Verlin Lonni BIRCH, MD  oxyCODONE  (OXY IR/ROXICODONE ) 5 MG immediate release tablet Take 1 tablet (5 mg total) by mouth every 8 (eight) hours as needed for severe pain Patient not taking: Reported on 09/22/2023 10/04/22     predniSONE  (STERAPRED UNI-PAK 21 TAB) 10 MG (21) TBPK tablet Follow package instructions Patient not taking: Reported on 01/07/2024 12/22/23     tamsulosin  (FLOMAX ) 0.4 MG CAPS capsule Take 1 capsule (0.4 mg total) by mouth daily. Patient not taking: Reported on 09/22/2023 10/04/22        All other systems have been reviewed and were otherwise negative with the exception of those mentioned in the HPI and as above.  Physical Exam: Vitals:   01/21/24 0640  BP: 120/79  Pulse: 66  Resp: 16  Temp: 97.8  F (36.6 C)  SpO2: 98%    Body mass index is 26.5 kg/m.  General: Alert, no acute distress Cardiovascular: No pedal edema Respiratory: No cyanosis, no use of accessory musculature Skin: No lesions in the area of chief complaint Neurologic: Sensation intact distally Psychiatric: Patient is competent for consent with normal mood and affect Lymphatic: No axillary or cervical lymphadenopathy   Assessment/Plan: CERVICAL RADICULOPATHY Plan for Procedure(s): ANTERIOR CERVICAL DECOMPRESSION FUSION CERVICAL 5- CERVICAL 6, CERVICAL 6- CERVICAL 7 WITH INSTRUMENTATION AND ALLOGRAFT   Kenneth LITTIE Priestly, MD 01/21/2024 8:22 AM

## 2024-01-21 NOTE — Op Note (Signed)
 PATIENT NAME: Kenneth Warren   MEDICAL RECORD NO.:   [id number]    DATE OF BIRTH: 27-May-1976   DATE OF PROCEDURE: 01/21/2024                               OPERATIVE REPORT     PREOPERATIVE DIAGNOSES: 1. Right-sided cervical radiculopathy 2. Spinal stenosis spanning C5-C7, including spinal cord compression   POSTOPERATIVE DIAGNOSES: 1. Right-sided cervical radiculopathy 2. Spinal stenosis spanning C5-C7, including spinal cord compression   PROCEDURE: 1. Anterior cervical decompression and fusion C5/6, C6/7 2. Placement of anterior instrumentation, C5-C7 3. Insertion of structural allograft x 2 (VG-2) 4. Intraoperative use of fluoroscopy   SURGEON:  Oneil Priestly, MD   ASSISTANT:  Ileana Clara, PA-C.   ANESTHESIA:  General endotracheal anesthesia.   COMPLICATIONS:  None.   DISPOSITION:  Stable.   ESTIMATED BLOOD LOSS:  Minimal.   INDICATIONS FOR SURGERY:  Briefly, Mr. Kenneth Warren is a pleasant 48 y.o. year- old male, who did present to me with severe pain in the neck and right arm.  The patient's MRI did reveal the findings noted above.  Given the patient's ongoing rather debilitating pain and lack of improvement with appropriate treatment measures, we did discuss proceeding with the procedure noted above.  The patient was fully aware of the risks and limitations of surgery as outlined in my preoperative note.   OPERATIVE DETAILS:  On 01/21/2024, the patient was brought to surgery and general endotracheal anesthesia was administered.  The patient was placed supine on the hospital bed. The neck was gently extended.  All bony prominences were meticulously padded.  The neck was prepped and draped in the usual sterile fashion.  At this point, I did make a left-sided transverse incision.  The platysma was incised.  A Smith-Robinson approach was used and the anterior spine was identified. A self-retaining retractor was placed.  I then subperiosteally exposed the  vertebral bodies from C5-C7.  Caspar pins were then placed into the C6 and C7 vertebral bodies and distraction was applied.  A thorough and complete C6-7 intervertebral diskectomy was performed.  The posterior longitudinal ligament was identified and entered using a nerve hook.  I then used #1 followed by #2 Kerrison to perform a thorough and complete intervertebral diskectomy.  The spinal canal was thoroughly decompressed, as was the right and left neuroforamen.  The endplates were then prepared and the appropriate-sized intervertebral structural allograft spacer was then tamped into position in the usual fashion.  The lower Caspar pin was then removed and placed into the C5 vertebral body and once again, distraction was applied across the C5-6 intervertebral space.  I then again performed a thorough and complete diskectomy, thoroughly decompressing the spinal canal and bilateral neuroforamena.  After preparing the endplates, the appropriate-sized intervertebral structural allograft spacer was tamped into position.  The Caspar pins then were removed and bone wax was placed in their place.  The appropriate-sized anterior cervical plate was placed over the anterior spine.  14 mm variable angle screws were placed, 2 in each vertebral body from C5-C7 for a total of 6 vertebral body screws.  The screws were then locked to the plate using the Cam locking mechanism.  I was very pleased with the final fluoroscopic images.  The wound was then irrigated.  The wound was then explored for any undue bleeding and there was no bleeding noted. The wound was then closed in layers using  2-0 Vicryl, followed by 4-0 Monocryl.  Benzoin and Steri-Strips were applied, followed by sterile dressing.  All instrument counts were correct at the termination of the procedure.   Of note, Ileana Clara, PA-C, was my assistant throughout surgery, and did aid in retraction, suctioning, placement of the hardware, and  closure from start to finish.     Oneil Priestly, MD

## 2024-01-21 NOTE — Transfer of Care (Signed)
 Immediate Anesthesia Transfer of Care Note  Patient: KAMDIN FOLLETT  Procedure(s) Performed: ANTERIOR CERVICAL DECOMPRESSION FUSION CERVICAL 5- CERVICAL 6, CERVICAL 6- CERVICAL 7 WITH INSTRUMENTATION AND ALLOGRAFT (Spine Cervical)  Patient Location: PACU  Anesthesia Type:General  Level of Consciousness: drowsy  Airway & Oxygen Therapy: Patient Spontanous Breathing  Post-op Assessment: Report given to RN and Post -op Vital signs reviewed and stable  Post vital signs: Reviewed and stable  Last Vitals:  Vitals Value Taken Time  BP 124/81 01/21/24 1126  Temp    Pulse 98 01/21/24 1128  Resp 18 01/21/24 1128  SpO2 92 % 01/21/24 1128  Vitals shown include unfiled device data.  Last Pain:  Vitals:   01/21/24 0656  TempSrc:   PainSc: 0-No pain         Complications: No notable events documented.

## 2024-01-21 NOTE — Progress Notes (Signed)
 Ambulate in PACU approximately 300 ft without complication.

## 2024-01-21 NOTE — Anesthesia Postprocedure Evaluation (Signed)
 Anesthesia Post Note  Patient: Kenneth Warren  Procedure(s) Performed: ANTERIOR CERVICAL DECOMPRESSION FUSION CERVICAL 5- CERVICAL 6, CERVICAL 6- CERVICAL 7 WITH INSTRUMENTATION AND ALLOGRAFT (Spine Cervical)     Patient location during evaluation: PACU Anesthesia Type: General Level of consciousness: awake and alert Pain management: pain level controlled Vital Signs Assessment: post-procedure vital signs reviewed and stable Respiratory status: spontaneous breathing, nonlabored ventilation and respiratory function stable Cardiovascular status: blood pressure returned to baseline and stable Postop Assessment: no apparent nausea or vomiting Anesthetic complications: no  No notable events documented.  Last Vitals:  Vitals:   01/21/24 1215 01/21/24 1230  BP: 109/80 101/70  Pulse: 86 75  Resp: 17 13  Temp:  36.6 C  SpO2: 91% 93%    Last Pain:  Vitals:   01/21/24 1230  TempSrc:   PainSc: 5                  Juliona Vales,W. EDMOND

## 2024-01-21 NOTE — Anesthesia Procedure Notes (Signed)
 Procedure Name: Intubation Date/Time: 01/21/2024 8:48 AM  Performed by: Claudene Florina Boga, CRNAPre-anesthesia Checklist: Patient identified, Emergency Drugs available, Suction available and Patient being monitored Patient Re-evaluated:Patient Re-evaluated prior to induction Oxygen Delivery Method: Circle System Utilized Preoxygenation: Pre-oxygenation with 100% oxygen Induction Type: IV induction Ventilation: Mask ventilation without difficulty and Oral airway inserted - appropriate to patient size Laryngoscope Size: Glidescope and 4 Grade View: Grade I Tube type: Oral Tube size: 7.5 mm Number of attempts: 1 Airway Equipment and Method: Oral airway, Rigid stylet and Video-laryngoscopy Placement Confirmation: ETT inserted through vocal cords under direct vision, positive ETCO2 and breath sounds checked- equal and bilateral Secured at: 24 cm Tube secured with: Tape Dental Injury: Teeth and Oropharynx as per pre-operative assessment

## 2024-01-23 MED FILL — Thrombin For Soln 20000 Unit: CUTANEOUS | Qty: 1 | Status: AC

## 2024-01-26 ENCOUNTER — Encounter (HOSPITAL_COMMUNITY): Payer: Self-pay | Admitting: Orthopedic Surgery

## 2024-02-04 DIAGNOSIS — M5412 Radiculopathy, cervical region: Secondary | ICD-10-CM | POA: Diagnosis not present

## 2024-03-01 DIAGNOSIS — M5412 Radiculopathy, cervical region: Secondary | ICD-10-CM | POA: Diagnosis not present

## 2024-03-12 ENCOUNTER — Other Ambulatory Visit: Payer: Self-pay

## 2024-03-24 DIAGNOSIS — I25118 Atherosclerotic heart disease of native coronary artery with other forms of angina pectoris: Secondary | ICD-10-CM | POA: Diagnosis not present

## 2024-03-24 LAB — HEPATIC FUNCTION PANEL
ALT: 20 IU/L (ref 0–44)
AST: 18 IU/L (ref 0–40)
Albumin: 4.2 g/dL (ref 4.1–5.1)
Alkaline Phosphatase: 125 IU/L — ABNORMAL HIGH (ref 44–121)
Bilirubin Total: 0.6 mg/dL (ref 0.0–1.2)
Bilirubin, Direct: 0.2 mg/dL (ref 0.00–0.40)
Total Protein: 6.2 g/dL (ref 6.0–8.5)

## 2024-03-24 LAB — LIPID PANEL
Chol/HDL Ratio: 2.8 ratio (ref 0.0–5.0)
Cholesterol, Total: 130 mg/dL (ref 100–199)
HDL: 46 mg/dL (ref >39)
LDL Chol Calc (NIH): 68 mg/dL (ref 0–99)
Triglycerides: 84 mg/dL (ref 0–149)
VLDL Cholesterol Cal: 16 mg/dL (ref 5–40)

## 2024-04-07 DIAGNOSIS — M5412 Radiculopathy, cervical region: Secondary | ICD-10-CM | POA: Diagnosis not present

## 2024-05-12 IMAGING — CT CT ABD-PELV W/ CM
2 of 5 series · 16 of 46 positions shown, 18 images · IV contrast (Omnipaque or Isovue)
Comparison: 06/18/2011

CLINICAL DATA: Lower abdominal pain

EXAM:
CT ABDOMEN AND PELVIS WITH CONTRAST
TECHNIQUE: Multidetector CT imaging of the abdomen and pelvis was performed
using the standard protocol following bolus administration of
intravenous contrast.

[Series 2: axial st · axial · 0.87mm/px · z∈[+1002,+1427]mm · 13 of 96 slices shown, 15 images]
[im 6/96  soft-tissue]
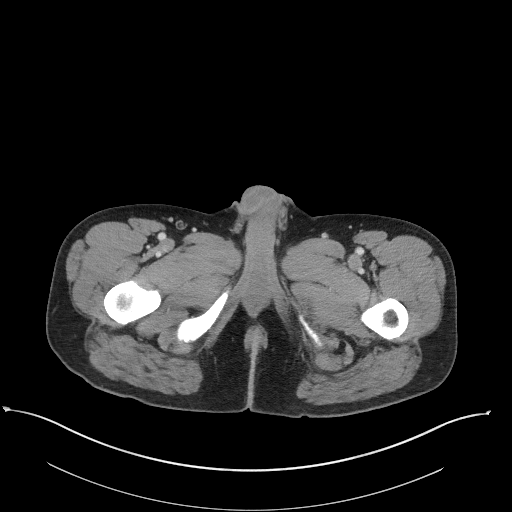
[im 6/96  bone]
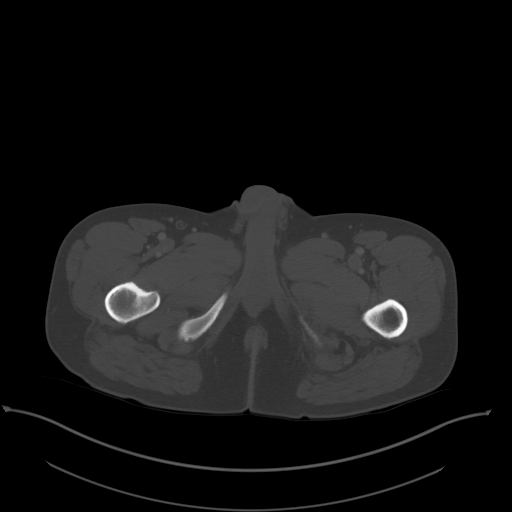
[im 16/96  soft-tissue]
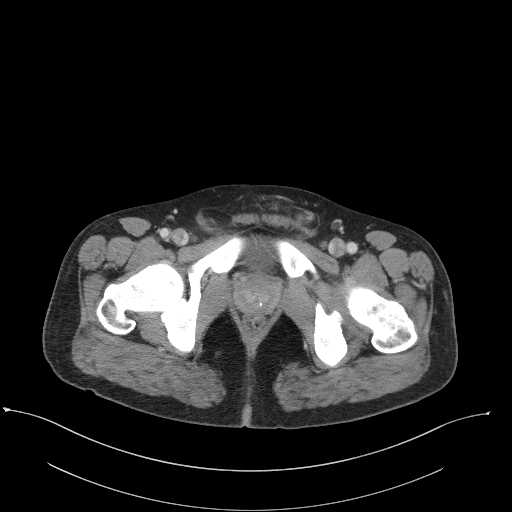
[im 21/96  soft-tissue]
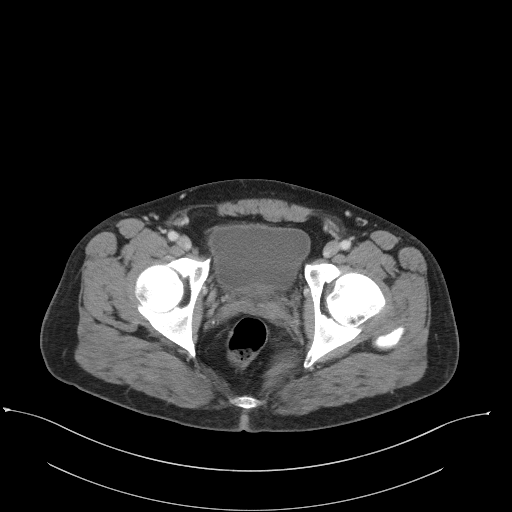
[im 26/96  soft-tissue]
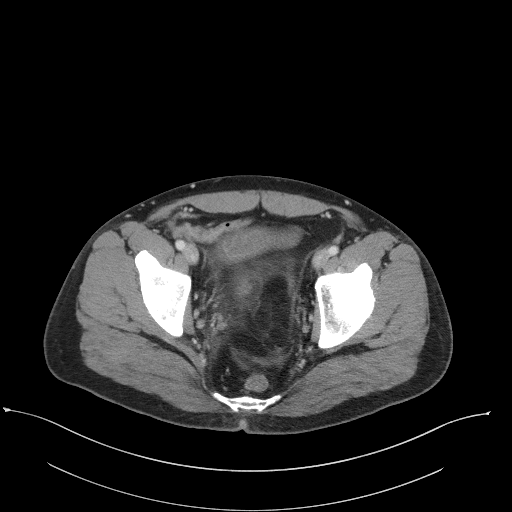
[im 36/96  soft-tissue]
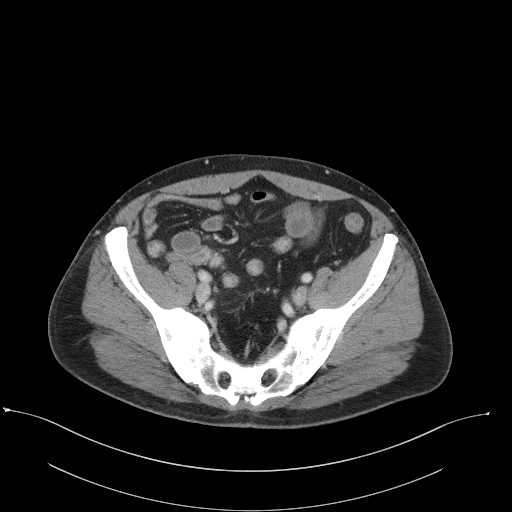
[im 41/96  soft-tissue]
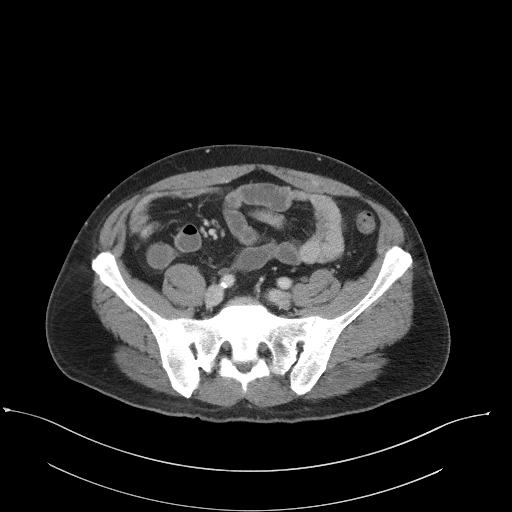
[im 51/96  soft-tissue]
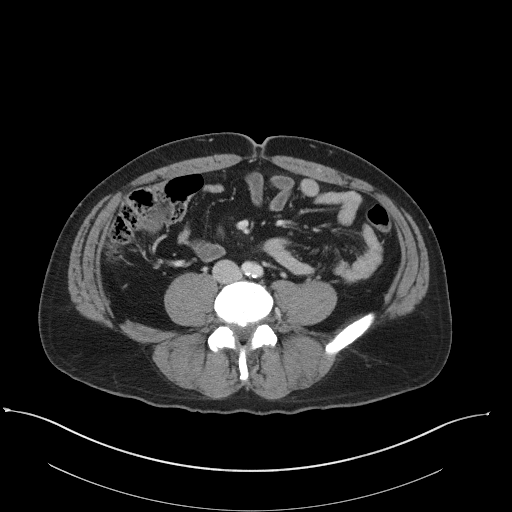
[im 56/96  soft-tissue]
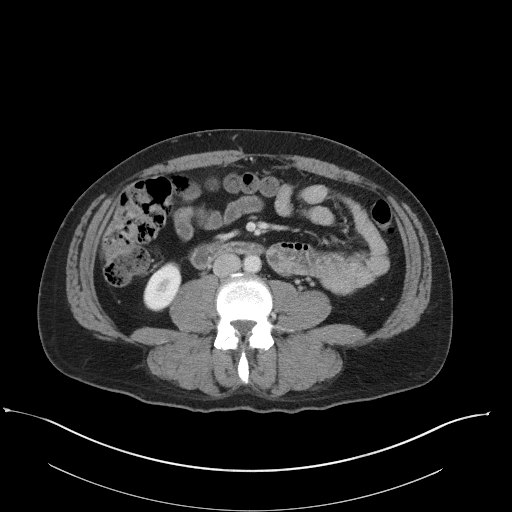
[im 61/96  soft-tissue]
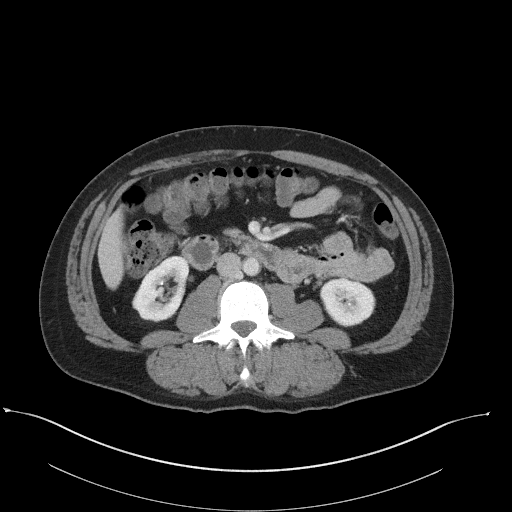
[im 61/96  bone]
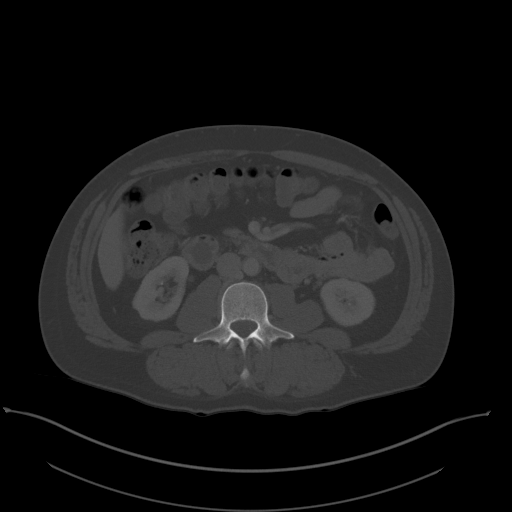
[im 71/96  soft-tissue]
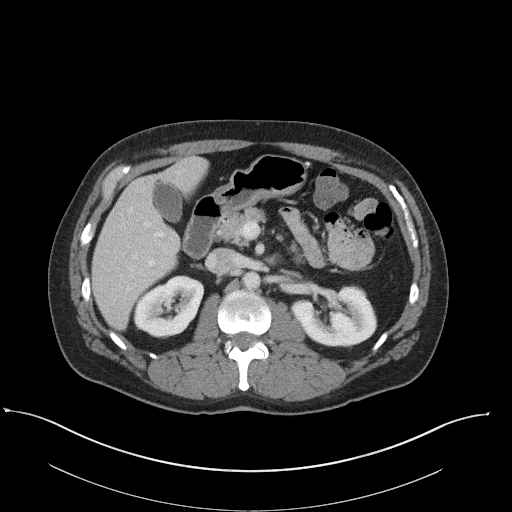
[im 76/96  soft-tissue]
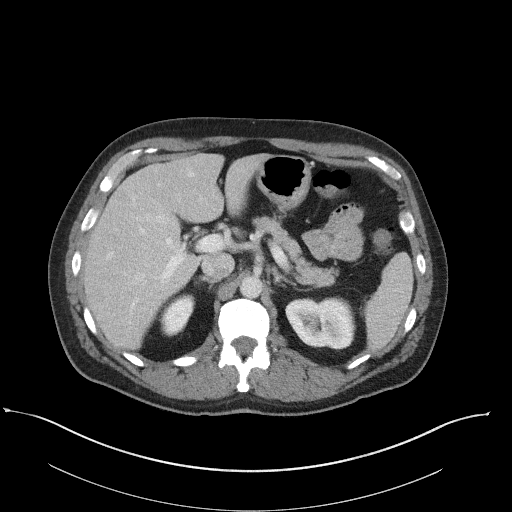
[im 81/96  soft-tissue]
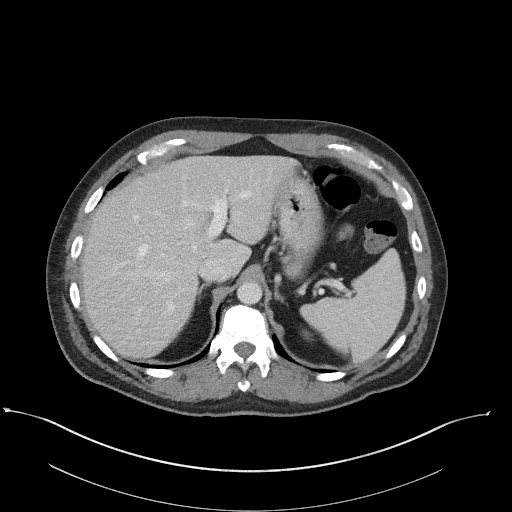
[im 91/96  soft-tissue]
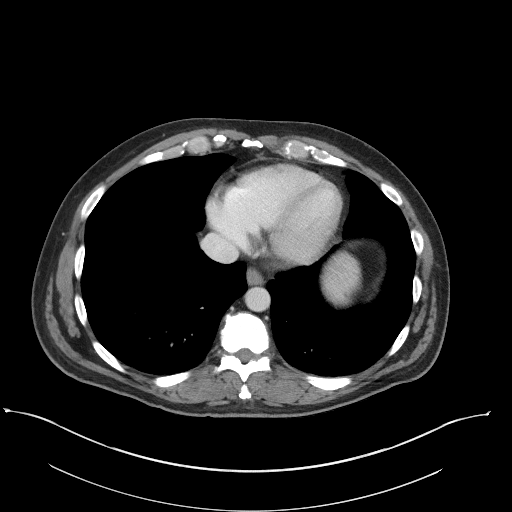

[Series 5: coronal st · coronal · 0.83mm/px · 3 of 103 slices shown]
[im 35/103  soft-tissue]
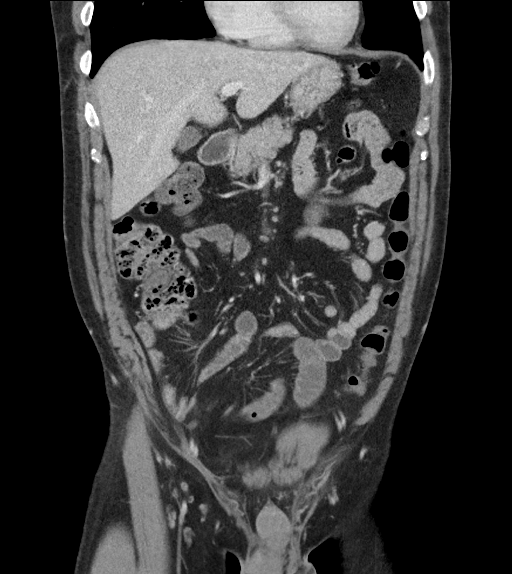
[im 46/103  soft-tissue]
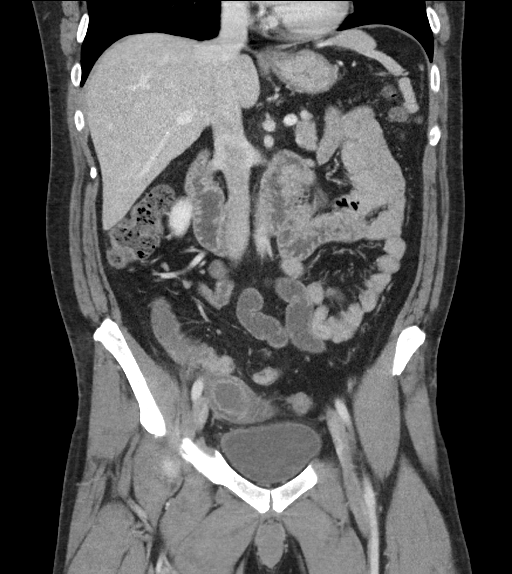
[im 57/103  soft-tissue]
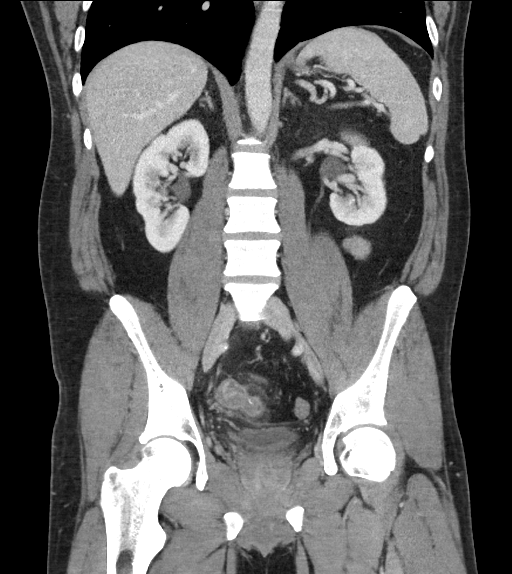

[16 of 46 positions shown; findings below may reference images not displayed]

RADIATION DOSE REDUCTION: This exam was performed according to the
departmental dose-optimization program which includes automated
exposure control, adjustment of the mA and/or kV according to
patient size and/or use of iterative reconstruction technique.

CONTRAST:  100mL OMNIPAQUE IOHEXOL 300 MG/ML  SOLN
FINDINGS: Lower chest: Lung bases are clear.

Hepatobiliary: Liver is within normal limits.

Gallbladder is unremarkable. No intrahepatic or extrahepatic ductal
dilatation.

Pancreas: Within normal limits.

Spleen: Within normal limits.

Adrenals/Urinary Tract: Adrenal glands are within normal limits.

Bilateral renal cysts, including a 1.7 cm right upper pole renal
sinus cyst (series 7/image 21). Multiple nonobstructing bilateral
renal calculi, measuring up to 5 mm in the left lower pole (series
2/image 30) and 4 mm in the right lower pole (series 2/image 34). No
ureteral or bladder calculi. No hydronephrosis.

Bladder is within normal limits.

Stomach/Bowel: Stomach is notable for a tiny hiatal hernia.

No evidence of bowel obstruction.

Suspected appendix is within normal limits (series 2/image 50).

Thickened loop of mid ileum in the right lower abdomen (series
2/image 69) with associated blind-ending tubular pouch and
surrounding inflammatory change (series 2/image 68), compatible with
Abchirovich Elmi diverticulitis.

No drainable fluid collection/abscess. No free air to suggest
macroscopic perforation.

No colonic wall thickening or inflammatory changes.

Vascular/Lymphatic: No evidence of abdominal aortic aneurysm.

Atherosclerotic calcifications of the abdominal aorta and branch
vessels.

No suspicious abdominopelvic lymphadenopathy.

Reproductive: Prostate is unremarkable.

Other: Trace pelvic fluid.

Musculoskeletal: Very mild degenerative changes at L3-4.
IMPRESSION: Acute Abchirovich Elmi diverticulitis. No drainable fluid
collection/abscess. No free air to suggest macroscopic perforation.
Surgical consultation is suggested.

Additional ancillary findings as above.

## 2024-06-10 DIAGNOSIS — Z125 Encounter for screening for malignant neoplasm of prostate: Secondary | ICD-10-CM | POA: Diagnosis not present

## 2024-06-10 DIAGNOSIS — E782 Mixed hyperlipidemia: Secondary | ICD-10-CM | POA: Diagnosis not present

## 2024-06-11 LAB — LAB REPORT - SCANNED: EGFR: 105

## 2024-06-15 ENCOUNTER — Other Ambulatory Visit: Payer: Self-pay

## 2024-06-15 MED ORDER — EZETIMIBE 10 MG PO TABS
10.0000 mg | ORAL_TABLET | Freq: Every day | ORAL | 2 refills | Status: AC
  Filled 2024-06-15 – 2024-09-22 (×2): qty 90, 90d supply, fill #0

## 2024-06-16 ENCOUNTER — Other Ambulatory Visit: Payer: Self-pay

## 2024-08-04 DIAGNOSIS — L821 Other seborrheic keratosis: Secondary | ICD-10-CM | POA: Diagnosis not present

## 2024-08-04 DIAGNOSIS — L859 Epidermal thickening, unspecified: Secondary | ICD-10-CM | POA: Diagnosis not present

## 2024-08-04 DIAGNOSIS — D1801 Hemangioma of skin and subcutaneous tissue: Secondary | ICD-10-CM | POA: Diagnosis not present

## 2024-08-04 DIAGNOSIS — L819 Disorder of pigmentation, unspecified: Secondary | ICD-10-CM | POA: Diagnosis not present

## 2024-08-04 DIAGNOSIS — Z86018 Personal history of other benign neoplasm: Secondary | ICD-10-CM | POA: Diagnosis not present

## 2024-08-04 DIAGNOSIS — L814 Other melanin hyperpigmentation: Secondary | ICD-10-CM | POA: Diagnosis not present

## 2024-08-04 DIAGNOSIS — D485 Neoplasm of uncertain behavior of skin: Secondary | ICD-10-CM | POA: Diagnosis not present

## 2024-08-04 DIAGNOSIS — L578 Other skin changes due to chronic exposure to nonionizing radiation: Secondary | ICD-10-CM | POA: Diagnosis not present

## 2024-08-04 DIAGNOSIS — D229 Melanocytic nevi, unspecified: Secondary | ICD-10-CM | POA: Diagnosis not present

## 2024-09-22 ENCOUNTER — Encounter: Payer: Self-pay | Admitting: Pharmacist

## 2024-09-22 ENCOUNTER — Other Ambulatory Visit: Payer: Self-pay

## 2024-09-22 ENCOUNTER — Other Ambulatory Visit (HOSPITAL_COMMUNITY): Payer: Self-pay

## 2024-10-18 DIAGNOSIS — R399 Unspecified symptoms and signs involving the genitourinary system: Secondary | ICD-10-CM | POA: Diagnosis not present

## 2024-10-18 DIAGNOSIS — N200 Calculus of kidney: Secondary | ICD-10-CM | POA: Diagnosis not present

## 2024-12-04 DIAGNOSIS — H524 Presbyopia: Secondary | ICD-10-CM | POA: Diagnosis not present

## 2024-12-27 ENCOUNTER — Other Ambulatory Visit: Payer: Self-pay

## 2024-12-27 MED ORDER — EZETIMIBE 10 MG PO TABS
10.0000 mg | ORAL_TABLET | Freq: Every day | ORAL | 2 refills | Status: AC
  Filled 2024-12-27 – 2024-12-31 (×2): qty 90, 90d supply, fill #0

## 2024-12-27 MED ORDER — ROSUVASTATIN CALCIUM 20 MG PO TABS
20.0000 mg | ORAL_TABLET | Freq: Every day | ORAL | 2 refills | Status: AC
  Filled 2024-12-27 – 2024-12-31 (×2): qty 90, 90d supply, fill #0

## 2024-12-28 ENCOUNTER — Other Ambulatory Visit: Payer: Self-pay

## 2024-12-31 ENCOUNTER — Other Ambulatory Visit (HOSPITAL_COMMUNITY): Payer: Self-pay

## 2024-12-31 ENCOUNTER — Other Ambulatory Visit: Payer: Self-pay

## 2025-01-02 ENCOUNTER — Other Ambulatory Visit (HOSPITAL_COMMUNITY): Payer: Self-pay
